# Patient Record
Sex: Female | Born: 1985 | Race: Black or African American | Hispanic: No | Marital: Married | Smoking: Former smoker
Health system: Southern US, Community
[De-identification: ages and names within clinical notes are randomized; demographics above are authoritative.]

## PROBLEM LIST (undated history)

## (undated) DIAGNOSIS — Z8709 Personal history of other diseases of the respiratory system: Secondary | ICD-10-CM

## (undated) HISTORY — DX: Personal history of other diseases of the respiratory system: Z87.09

## (undated) SURGERY — OPEN REDUCTION INTERNAL FIXATION (ORIF) RADIAL FRACTURE
Anesthesia: Choice | Laterality: Left

---

## 2009-05-12 DIAGNOSIS — O4290 Premature rupture of membranes, unspecified as to length of time between rupture and onset of labor, unspecified weeks of gestation: Secondary | ICD-10-CM

## 2010-06-24 ENCOUNTER — Emergency Department: Payer: Self-pay | Admitting: Emergency Medicine

## 2012-03-18 ENCOUNTER — Emergency Department: Payer: Self-pay | Admitting: Internal Medicine

## 2013-06-23 ENCOUNTER — Encounter (HOSPITAL_COMMUNITY): Payer: Self-pay | Admitting: Emergency Medicine

## 2013-06-23 ENCOUNTER — Emergency Department (HOSPITAL_COMMUNITY): Payer: Medicaid Other

## 2013-06-23 ENCOUNTER — Encounter (HOSPITAL_COMMUNITY): Payer: Medicaid Other | Admitting: Anesthesiology

## 2013-06-23 ENCOUNTER — Observation Stay (HOSPITAL_COMMUNITY)
Admission: EM | Admit: 2013-06-23 | Discharge: 2013-06-24 | Disposition: A | Discharge status: Home / Self Care | Payer: Medicaid Other | Attending: Orthopedic Surgery | Admitting: Orthopedic Surgery

## 2013-06-23 ENCOUNTER — Emergency Department (HOSPITAL_COMMUNITY): Payer: Medicaid Other | Admitting: Anesthesiology

## 2013-06-23 ENCOUNTER — Encounter (HOSPITAL_COMMUNITY)
Admission: EM | Disposition: A | Discharge status: Home / Self Care | Payer: Self-pay | Source: Home / Self Care | Attending: Emergency Medicine

## 2013-06-23 DIAGNOSIS — F17200 Nicotine dependence, unspecified, uncomplicated: Secondary | ICD-10-CM | POA: Insufficient documentation

## 2013-06-23 DIAGNOSIS — S5290XA Unspecified fracture of unspecified forearm, initial encounter for closed fracture: Principal | ICD-10-CM | POA: Insufficient documentation

## 2013-06-23 DIAGNOSIS — S52202A Unspecified fracture of shaft of left ulna, initial encounter for closed fracture: Secondary | ICD-10-CM

## 2013-06-23 DIAGNOSIS — S52209A Unspecified fracture of shaft of unspecified ulna, initial encounter for closed fracture: Secondary | ICD-10-CM

## 2013-06-23 HISTORY — PX: ORIF ULNAR FRACTURE: SHX5417

## 2013-06-23 LAB — CBC
Hemoglobin: 12.5 g/dL (ref 12.0–15.0)
MCH: 27.3 pg (ref 26.0–34.0)
Platelets: 310 10*3/uL (ref 150–400)
RBC: 4.58 MIL/uL (ref 3.87–5.11)
WBC: 11.1 10*3/uL — ABNORMAL HIGH (ref 4.0–10.5)

## 2013-06-23 LAB — COMPREHENSIVE METABOLIC PANEL
ALT: 9 U/L (ref 0–35)
AST: 16 U/L (ref 0–37)
Alkaline Phosphatase: 88 U/L (ref 39–117)
CO2: 25 mEq/L (ref 19–32)
Calcium: 9 mg/dL (ref 8.4–10.5)
Chloride: 101 mEq/L (ref 96–112)
GFR calc non Af Amer: >90 mL/min (ref >90)
Potassium: 3.3 mEq/L — ABNORMAL LOW (ref 3.5–5.1)
Sodium: 137 mEq/L (ref 135–145)
Total Bilirubin: 0.3 mg/dL (ref 0.3–1.2)

## 2013-06-23 LAB — LACTIC ACID, PLASMA: Lactic Acid, Venous: 1.7 mmol/L (ref 0.5–2.2)

## 2013-06-23 SURGERY — OPEN REDUCTION INTERNAL FIXATION (ORIF) ULNAR FRACTURE
Anesthesia: General | Site: Arm Lower | Laterality: Left | Wound class: Clean

## 2013-06-23 MED ORDER — FENTANYL CITRATE 0.05 MG/ML IJ SOLN
INTRAMUSCULAR | Status: DC | PRN
  Administered 2013-06-23 (×2): 50 ug via INTRAVENOUS

## 2013-06-23 MED ORDER — SODIUM CHLORIDE 0.9 % IV SOLN
INTRAVENOUS | Status: DC
  Administered 2013-06-23: via INTRAVENOUS

## 2013-06-23 MED ORDER — BUPIVACAINE-EPINEPHRINE PF 0.5-1:200000 % IJ SOLN
INTRAMUSCULAR | Status: DC | PRN
  Administered 2013-06-23: 30 mL via PERINEURAL

## 2013-06-23 MED ORDER — FENTANYL CITRATE 0.05 MG/ML IJ SOLN
50.0000 ug | Freq: Once | INTRAMUSCULAR | Status: AC
  Administered 2013-06-23: 50 ug via INTRAVENOUS
  Filled 2013-06-23: qty 2

## 2013-06-23 MED ORDER — OXYCODONE-ACETAMINOPHEN 5-325 MG PO TABS: 1.0000 | ORAL_TABLET | Freq: Four times a day (QID) | ORAL | Status: DC | PRN

## 2013-06-23 MED ORDER — HYDROMORPHONE HCL PF 1 MG/ML IJ SOLN
1.0000 mg | INTRAMUSCULAR | Status: DC | PRN
  Administered 2013-06-23 (×2): 1 mg via INTRAVENOUS
  Filled 2013-06-23 (×2): qty 1

## 2013-06-23 MED ORDER — HYDROMORPHONE HCL PF 1 MG/ML IJ SOLN: 0.2500 mg | INTRAMUSCULAR | Status: DC | PRN

## 2013-06-23 MED ORDER — ONDANSETRON HCL 4 MG/2ML IJ SOLN
4.0000 mg | Freq: Once | INTRAMUSCULAR | Status: AC
  Administered 2013-06-23: 4 mg via INTRAVENOUS
  Filled 2013-06-23: qty 2

## 2013-06-23 MED ORDER — ONDANSETRON HCL 4 MG/2ML IJ SOLN
INTRAMUSCULAR | Status: DC | PRN
  Administered 2013-06-23: 4 mg via INTRAVENOUS

## 2013-06-23 MED ORDER — ONDANSETRON HCL 4 MG/2ML IJ SOLN: 4.0000 mg | Freq: Once | INTRAMUSCULAR | Status: DC | PRN

## 2013-06-23 MED ORDER — MEPERIDINE HCL 25 MG/ML IJ SOLN: 6.2500 mg | INTRAMUSCULAR | Status: DC | PRN

## 2013-06-23 MED ORDER — OXYCODONE HCL 5 MG PO TABS: 5.0000 mg | ORAL_TABLET | Freq: Once | ORAL | Status: DC | PRN

## 2013-06-23 MED ORDER — LIDOCAINE HCL (CARDIAC) 20 MG/ML IV SOLN
INTRAVENOUS | Status: DC | PRN
  Administered 2013-06-23: 100 mg via INTRAVENOUS

## 2013-06-23 MED ORDER — SUCCINYLCHOLINE CHLORIDE 20 MG/ML IJ SOLN
INTRAMUSCULAR | Status: DC | PRN
  Administered 2013-06-23: 100 mg via INTRAVENOUS

## 2013-06-23 MED ORDER — CEFAZOLIN SODIUM-DEXTROSE 2-3 GM-% IV SOLR
INTRAVENOUS | Status: DC | PRN
  Administered 2013-06-23: 2 g via INTRAVENOUS

## 2013-06-23 MED ORDER — LACTATED RINGERS IV SOLN
INTRAVENOUS | Status: DC | PRN
  Administered 2013-06-23: 20:00:00 via INTRAVENOUS

## 2013-06-23 MED ORDER — MIDAZOLAM HCL 5 MG/5ML IJ SOLN
INTRAMUSCULAR | Status: DC | PRN
  Administered 2013-06-23: 2 mg via INTRAVENOUS

## 2013-06-23 MED ORDER — PROPOFOL 10 MG/ML IV BOLUS
INTRAVENOUS | Status: DC | PRN
  Administered 2013-06-23: 200 mg via INTRAVENOUS

## 2013-06-23 MED ORDER — OXYCODONE HCL 5 MG/5ML PO SOLN
5.0000 mg | Freq: Once | ORAL | Status: DC | PRN
Start: 2013-06-23 — End: 2013-06-23

## 2013-06-23 MED ORDER — EPHEDRINE SULFATE 50 MG/ML IJ SOLN
INTRAMUSCULAR | Status: DC | PRN
  Administered 2013-06-23 (×2): 10 mg via INTRAVENOUS

## 2013-06-23 SURGICAL SUPPLY — 56 items
BANDAGE ELASTIC 3 VELCRO ST LF (GAUZE/BANDAGES/DRESSINGS) ×2 IMPLANT
BANDAGE ELASTIC 4 VELCRO ST LF (GAUZE/BANDAGES/DRESSINGS) ×2 IMPLANT
BANDAGE GAUZE ELAST BULKY 4 IN (GAUZE/BANDAGES/DRESSINGS) ×2 IMPLANT
BIT DRILL 2.5X2.75 QC CALB (BIT) ×2 IMPLANT
BIT DRILL CALIBRATED 2.7 (BIT) ×2 IMPLANT
BLADE SURG ROTATE 9660 (MISCELLANEOUS) IMPLANT
BNDG ESMARK 4X9 LF (GAUZE/BANDAGES/DRESSINGS) ×2 IMPLANT
CLOTH BEACON ORANGE TIMEOUT ST (SAFETY) ×2 IMPLANT
COVER SURGICAL LIGHT HANDLE (MISCELLANEOUS) ×2 IMPLANT
CUFF TOURNIQUET SINGLE 18IN (TOURNIQUET CUFF) ×2 IMPLANT
CUFF TOURNIQUET SINGLE 24IN (TOURNIQUET CUFF) IMPLANT
DRAPE OEC MINIVIEW 54X84 (DRAPES) ×2 IMPLANT
DRAPE U-SHAPE 47X51 STRL (DRAPES) ×2 IMPLANT
ELECT REM PT RETURN 9FT ADLT (ELECTROSURGICAL) ×2
ELECTRODE REM PT RTRN 9FT ADLT (ELECTROSURGICAL) ×1 IMPLANT
GAUZE XEROFORM 5X9 LF (GAUZE/BANDAGES/DRESSINGS) ×2 IMPLANT
GLOVE BIO SURGEON STRL SZ7.5 (GLOVE) ×4 IMPLANT
GLOVE BIOGEL PI IND STRL 8 (GLOVE) ×2 IMPLANT
GLOVE BIOGEL PI INDICATOR 8 (GLOVE) ×2
GOWN PREVENTION PLUS XLARGE (GOWN DISPOSABLE) ×2 IMPLANT
GOWN SRG XL XLNG 56XLVL 4 (GOWN DISPOSABLE) ×1 IMPLANT
GOWN STRL NON-REIN LRG LVL3 (GOWN DISPOSABLE) ×2 IMPLANT
GOWN STRL NON-REIN XL XLG LVL4 (GOWN DISPOSABLE) ×1
KIT BASIN OR (CUSTOM PROCEDURE TRAY) ×2 IMPLANT
KIT ROOM TURNOVER OR (KITS) ×2 IMPLANT
MANIFOLD NEPTUNE II (INSTRUMENTS) IMPLANT
NEEDLE 22X1 1/2 (OR ONLY) (NEEDLE) IMPLANT
NS IRRIG 1000ML POUR BTL (IV SOLUTION) ×2 IMPLANT
PACK ORTHO EXTREMITY (CUSTOM PROCEDURE TRAY) ×2 IMPLANT
PAD ARMBOARD 7.5X6 YLW CONV (MISCELLANEOUS) IMPLANT
PAD CAST 3X4 CTTN HI CHSV (CAST SUPPLIES) ×1 IMPLANT
PAD CAST 4YDX4 CTTN HI CHSV (CAST SUPPLIES) ×1 IMPLANT
PADDING CAST COTTON 3X4 STRL (CAST SUPPLIES) ×1
PADDING CAST COTTON 4X4 STRL (CAST SUPPLIES) ×1
PLATE LOCK COMP 6H FOOT (Plate) ×2 IMPLANT
PLATE LOCK COMP 7H FOOT (Plate) ×2 IMPLANT
SCREW CORTICAL 3.5MM  16MM (Screw) ×1 IMPLANT
SCREW CORTICAL 3.5MM 14MM (Screw) ×6 IMPLANT
SCREW CORTICAL 3.5MM 16MM (Screw) ×1 IMPLANT
SCREW LOCK CORT STAR 3.5X10 (Screw) ×8 IMPLANT
SCREW LOCK CORT STAR 3.5X12 (Screw) ×6 IMPLANT
SCREW LOCK CORT STAR 3.5X14 (Screw) ×2 IMPLANT
SLING ARM FOAM STRAP LRG (SOFTGOODS) ×2 IMPLANT
SPLINT FIBERGLASS 3X12 (CAST SUPPLIES) ×2 IMPLANT
SPONGE GAUZE 4X4 12PLY (GAUZE/BANDAGES/DRESSINGS) ×2 IMPLANT
SPONGE LAP 4X18 X RAY DECT (DISPOSABLE) ×2 IMPLANT
SUCTION FRAZIER TIP 10 FR DISP (SUCTIONS) ×2 IMPLANT
SUT ETHILON 4 0 PS 2 18 (SUTURE) IMPLANT
SUT MNCRL AB 3-0 PS2 18 (SUTURE) ×2 IMPLANT
SUT VIC AB 2-0 CT1 27 (SUTURE) ×1
SUT VIC AB 2-0 CT1 TAPERPNT 27 (SUTURE) ×1 IMPLANT
SYR CONTROL 10ML LL (SYRINGE) IMPLANT
TOWEL OR 17X24 6PK STRL BLUE (TOWEL DISPOSABLE) ×2 IMPLANT
TOWEL OR 17X26 10 PK STRL BLUE (TOWEL DISPOSABLE) ×2 IMPLANT
TUBE CONNECTING 12X1/4 (SUCTIONS) ×2 IMPLANT
WATER STERILE IRR 1000ML POUR (IV SOLUTION) IMPLANT

## 2013-06-23 NOTE — Transfer of Care (Signed)
Immediate Anesthesia Transfer of Care Note  Patient: Judy Schultz  Procedure(s) Performed: Procedure(s): OPEN REDUCTION INTERNAL FIXATION (ORIF) ULNAR/Radius FRACTURE (Left)  Patient Location: PACU  Anesthesia Type:General  Level of Consciousness: awake, alert  and oriented  Airway & Oxygen Therapy: Patient Spontanous Breathing and Patient connected to nasal cannula oxygen  Post-op Assessment: Report given to PACU RN and Post -op Vital signs reviewed and stable  Post vital signs: Reviewed and stable  Complications: No apparent anesthesia complications

## 2013-06-23 NOTE — Progress Notes (Signed)
The patient has not had pain in c-spine per patient and er staff.  X-rays c1-c6 normal and ct scan recommended but ER staff felt not indicated because she has nort had pain.  I have removed collar and carefully tested her tenderness which does not exist.  No pain with forward flex against resistance and no pain with extension against resistance.  No pain with side bending and no pain with flex/ext.  Pt cleared clinically and c-collar removed.

## 2013-06-23 NOTE — ED Notes (Signed)
MD at bedside. 

## 2013-06-23 NOTE — ED Notes (Signed)
Dr. Luiz Blare in to talk and assess pt for surgery.  OR consent signed by pt's husband.  Pt to OR

## 2013-06-23 NOTE — Brief Op Note (Signed)
06/23/2013  10:21 PM  PATIENT:  Judy Schultz  27 y.o. female  PRE-OPERATIVE DIAGNOSIS:  Left Ulnar/Radius Fracture  POST-OPERATIVE DIAGNOSIS:  Left Ulnar/Radius Fracture  PROCEDURE:  Procedure(s): OPEN REDUCTION INTERNAL FIXATION (ORIF) ULNAR/Radius FRACTURE (Left)  SURGEON:  Surgeon(s) and Role:    * Harvie Junior, MD - Primary  PHYSICIAN ASSISTANT:   ASSISTANTS: bethune   ANESTHESIA:   general  EBL:     BLOOD ADMINISTERED:none  DRAINS: none   LOCAL MEDICATIONS USED:  NONE  SPECIMEN:  No Specimen  DISPOSITION OF SPECIMEN:  N/A  COUNTS:  YES  TOURNIQUET:  * Missing tourniquet times found for documented tourniquets in log:  [id number] *  DICTATION: .Other Dictation: Dictation Number [id number]  PLAN OF CARE: Admit for overnight observation  PATIENT DISPOSITION:  PACU - hemodynamically stable.   Delay start of Pharmacological VTE agent (>24hrs) due to surgical blood loss or risk of bleeding: no
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
## 2013-06-23 NOTE — Anesthesia Procedure Notes (Addendum)
Anesthesia Regional Block:  Supraclavicular block  Pre-Anesthetic Checklist: ,, timeout performed, Correct Patient, Correct Site, Correct Laterality, Correct Procedure, Correct Position, site marked, Risks and benefits discussed,  Surgical consent,  Pre-op evaluation,  At surgeon's request and post-op pain management  Laterality: Left  Prep: chloraprep       Needles:  Injection technique: Single-shot  Needle Type: Echogenic Stimulator Needle     Needle Length: 9cm  Needle Gauge: 21 and 21 G    Additional Needles:  Procedures: ultrasound guided (picture in chart) and nerve stimulator Supraclavicular block  Nerve Stimulator or Paresthesia:  Response: 0.4 mA,   Additional Responses:   Narrative:  Start time: 06/23/2013 8:30 PM End time: 06/23/2013 8:45 PM Injection made incrementally with aspirations every 5 mL.  Performed by: Personally  Anesthesiologist: Arta Bruce MD  Additional Notes: Monitors applied. Patient sedated. Sterile prep and drape,hand hygiene and sterile gloves were used. Relevant anatomy identified.Needle position confirmed.Local anesthetic injected incrementally after negative aspiration. Local anesthetic spread visualized around nerve(s). Vascular puncture avoided. No complications. Image printed for medical record.The patient tolerated the procedure well.        Procedure Name: Intubation Date/Time: 06/23/2013 8:58 PM Performed by: Molli Hazard Pre-anesthesia Checklist: Patient identified, Emergency Drugs available, Suction available and Patient being monitored Patient Re-evaluated:Patient Re-evaluated prior to inductionOxygen Delivery Method: Circle system utilized Preoxygenation: Pre-oxygenation with 100% oxygen Intubation Type: IV induction, Rapid sequence and Cricoid Pressure applied Laryngoscope Size: Miller and 2 Grade View: Grade I Tube type: Oral Tube size: 7.5 mm Number of attempts: 1 Airway Equipment and Method: Stylet Placement  Confirmation: ETT inserted through vocal cords under direct vision,  positive ETCO2 and breath sounds checked- equal and bilateral Secured at: 21 cm Tube secured with: Tape Dental Injury: Teeth and Oropharynx as per pre-operative assessment

## 2013-06-23 NOTE — ED Notes (Signed)
Patient returned from XR. 
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
## 2013-06-23 NOTE — Preoperative (Signed)
Beta Blockers   Reason not to administer Beta Blockers:Not Applicable 

## 2013-06-23 NOTE — Anesthesia Preprocedure Evaluation (Signed)
Anesthesia Evaluation  Patient identified by MRN, date of birth, ID band Patient awake    Reviewed: Allergy & Precautions, H&P , NPO status , Patient's Chart, lab work & pertinent test results  Airway Mallampati: I TM Distance: >3 FB Neck ROM: Full    Dental   Pulmonary Current Smoker,          Cardiovascular     Neuro/Psych    GI/Hepatic   Endo/Other    Renal/GU      Musculoskeletal   Abdominal   Peds  Hematology   Anesthesia Other Findings   Reproductive/Obstetrics                           Anesthesia Physical Anesthesia Plan  ASA: II and emergent  Anesthesia Plan: General   Post-op Pain Management:    Induction: Intravenous, Rapid sequence and Cricoid pressure planned  Airway Management Planned: Oral ETT  Additional Equipment:   Intra-op Plan:   Post-operative Plan: Extubation in OR  Informed Consent: I have reviewed the patients History and Physical, chart, labs and discussed the procedure including the risks, benefits and alternatives for the proposed anesthesia with the patient or authorized representative who has indicated his/her understanding and acceptance.     Plan Discussed with: CRNA and Surgeon  Anesthesia Plan Comments:         Anesthesia Quick Evaluation  

## 2013-06-24 MED ORDER — METHOCARBAMOL 100 MG/ML IJ SOLN: 500.0000 mg | Freq: Four times a day (QID) | INTRAVENOUS | Status: DC | PRN

## 2013-06-24 MED ORDER — PROMETHAZINE HCL 25 MG/ML IJ SOLN: 12.5000 mg | Freq: Four times a day (QID) | INTRAMUSCULAR | Status: DC | PRN

## 2013-06-24 MED ORDER — CEFAZOLIN SODIUM-DEXTROSE 2-3 GM-% IV SOLR
2.0000 g | Freq: Four times a day (QID) | INTRAVENOUS | Status: DC
  Administered 2013-06-24 (×2): 2 g via INTRAVENOUS
  Filled 2013-06-24 (×3): qty 50

## 2013-06-24 MED ORDER — ONDANSETRON HCL 4 MG/2ML IJ SOLN: 4.0000 mg | Freq: Four times a day (QID) | INTRAMUSCULAR | Status: DC | PRN

## 2013-06-24 MED ORDER — OXYCODONE-ACETAMINOPHEN 5-325 MG PO TABS
1.0000 | ORAL_TABLET | ORAL | Status: DC | PRN
  Administered 2013-06-24 (×2): 2 via ORAL
  Filled 2013-06-24 (×2): qty 2

## 2013-06-24 MED ORDER — ONDANSETRON HCL 4 MG PO TABS: 4.0000 mg | ORAL_TABLET | Freq: Four times a day (QID) | ORAL | Status: DC | PRN

## 2013-06-24 MED ORDER — METHOCARBAMOL 500 MG PO TABS
500.0000 mg | ORAL_TABLET | Freq: Four times a day (QID) | ORAL | Status: DC | PRN
  Administered 2013-06-24: 500 mg via ORAL
  Filled 2013-06-24: qty 1

## 2013-06-24 MED ORDER — HYDROMORPHONE HCL PF 1 MG/ML IJ SOLN: 1.0000 mg | INTRAMUSCULAR | Status: DC | PRN

## 2013-06-24 NOTE — Anesthesia Postprocedure Evaluation (Signed)
Anesthesia Post Note  Patient: Judy Schultz  Procedure(s) Performed: Procedure(s) (LRB): OPEN REDUCTION INTERNAL FIXATION (ORIF) ULNAR/Radius FRACTURE (Left)  Anesthesia type: general  Patient location: PACU  Post pain: Pain level controlled  Post assessment: Patient's Cardiovascular Status Stable  Last Vitals:  Filed Vitals:   06/23/13 2356  BP: 105/50  Pulse: 76  Temp: 36.4 C  Resp: 16    Post vital signs: Reviewed and stable  Level of consciousness: sedated  Complications: No apparent anesthesia complications
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
## 2013-06-24 NOTE — Progress Notes (Signed)
Subjective: 1 Day Post-Op Procedure(s) (LRB): OPEN REDUCTION INTERNAL FIXATION (ORIF) ULNAR/Radius FRACTURE (Left) Patient reports pain as 2 on 0-10 scale.   Denies neck pain. No other c/o's. Taking po/voiding ok. Objective: Vital signs in last 24 hours: Temp:  [97.5 F (36.4 C)-98.6 F (37 C)] 98.6 F (37 C) (11/27 0539) Pulse Rate:  [68-100] 100 (11/27 0539) Resp:  [16-20] 16 (11/27 0539) BP: (105-135)/(46-65) 126/53 mmHg (11/27 0539) SpO2:  [95 %-100 %] 95 % (11/27 0539) Weight:  [95.255 kg (210 lb)] 95.255 kg (210 lb) (11/26 1915)  Intake/Output from previous day: 11/26 0701 - 11/27 0700 In: 1978.8 [P.O.:460; I.V.:1518.8] Out: 1 [Urine:1] Intake/Output this shift: Total I/O In: 50 [IV Piggyback:50] Out: -    Recent Labs  06/23/13 1645  HGB 12.5    Recent Labs  06/23/13 1645  WBC 11.1*  RBC 4.58  HCT 37.3  PLT 310    Recent Labs  06/23/13 1645  NA 137  K 3.3*  CL 101  CO2 25  BUN 7  CREATININE 0.78  GLUCOSE 107*  CALCIUM 9.0  Neurologically intact Neurovascular intact Sensation intact distally Sugar tong splint intact to left upper ext. Neck nontender  FCROM.   Assessment/Plan: 1 Day Post-Op Procedure(s) (LRB): OPEN REDUCTION INTERNAL FIXATION (ORIF) ULNAR/Radius FRACTURE (Left) Plan: Discharge home. F/U Dr Luiz Blare in 2 weeks.  Splint loosened. Briar Sword G 06/24/2013, 10:29 AM
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
## 2013-06-24 NOTE — Progress Notes (Signed)
Orthopedic Tech Progress Note Patient Details:  Judy Schultz 1986-01-01 [id number] Arm sling applied for discharge Ortho Devices Type of Ortho Device: Arm sling Ortho Device/Splint Interventions: Application   Asia R Thompson 06/24/2013, 12:16 PM

## 2013-06-29 ENCOUNTER — Encounter (HOSPITAL_COMMUNITY): Payer: Self-pay | Admitting: Orthopedic Surgery

## 2015-07-04 ENCOUNTER — Ambulatory Visit (INDEPENDENT_AMBULATORY_CARE_PROVIDER_SITE_OTHER): Payer: BLUE CROSS/BLUE SHIELD | Admitting: Certified Nurse Midwife

## 2015-07-04 ENCOUNTER — Encounter: Payer: Self-pay | Admitting: Certified Nurse Midwife

## 2015-07-04 VITALS — BP 114/70 | HR 82 | Ht 68.0 in | Wt 252.0 lb

## 2015-07-04 DIAGNOSIS — Z3687 Encounter for antenatal screening for uncertain dates: Secondary | ICD-10-CM

## 2015-07-04 DIAGNOSIS — Z331 Pregnant state, incidental: Secondary | ICD-10-CM

## 2015-07-04 DIAGNOSIS — Z36 Encounter for antenatal screening of mother: Secondary | ICD-10-CM

## 2015-07-04 DIAGNOSIS — N926 Irregular menstruation, unspecified: Secondary | ICD-10-CM

## 2015-07-04 DIAGNOSIS — Z3490 Encounter for supervision of normal pregnancy, unspecified, unspecified trimester: Secondary | ICD-10-CM

## 2015-07-04 LAB — POCT URINE PREGNANCY: PREG TEST UR: POSITIVE — AB

## 2015-07-04 NOTE — Progress Notes (Signed)
Patient ID: Judy Schultz, female   DOB: 25-Aug-1985, 29 y.o.   MRN: [id number] Subjective:    Judy PaddockJasmine Wheelwright is a 29 y.o. female who presents for evaluation of amenorrhea. She believes she could be pregnant. Pregnancy is desired. Sexual Activity: single partner, contraception: none. Current symptoms also include: breast tenderness, frequent urination and nausea. Last period was normal.   Patient's last menstrual period was 05/27/2015 (approximate). The following portions of the patient's history were reviewed and updated as appropriate: allergies, current medications, past family history, past medical history, past social history, past surgical history and problem list.  Review of Systems Pertinent items noted in HPI and remainder of comprehensive ROS otherwise negative.     Objective:    BP 114/70 mmHg  Pulse 82  Ht 5\' 8"  (1.727 m)  Wt 252 lb (114.306 kg)  BMI 38.33 kg/m2  LMP 05/27/2015 (Approximate) General: alert, cooperative, appears stated age and no acute distress    Lab Review Urine HCG: positive    Assessment:    Absence of menstruation.     Plan:    Pregnancy Test: Positive: EDC: 03/02/16. Briefly discussed pre-natal care options. Pregnancy, Childbirth and the Newborn book given. Encouraged well-balanced diet, plenty of rest when needed, pre-natal vitamins daily and walking for exercise. Discussed self-help for nausea, avoiding OTC medications until consulting provider or pharmacist, other than Tylenol as needed, minimal caffeine (1-2 cups daily) and avoiding alcohol. She will schedule her initial OB visit in the next month with her PCP or OB provider. Feel free to call with any questions. .   Return in 4 weeks for dating sonogram and RN NOB intake 30 minute visit with greater than 50 % spent on counseling.

## 2015-07-04 NOTE — Progress Notes (Signed)
Patient ID: Judy Schultz, female   DOB: 02-Feb-1986, 29 y.o.   MRN: [id number] Pt presents for pregnancy confirmation. UPT: positive. LMP: 05/27/2015. EGA: 5.3 Wks. EDD 03/02/2016. G2 P1001

## 2015-07-30 NOTE — L&D Delivery Note (Signed)
Delivery Note Called to room for precipitous delivery.  At 2:13 PM a viable and healthy female was delivered via  (Presentation: LOA ).  Double nuchal and one body cord.   APGAR: 7, 9; weight 3 lb 5.6 oz (1520 g).   Placenta status: intact > to pathology, .  Cord: 3 vessels with the following complications: .  Cord pH: n/a  Anesthesia:  Epidural Episiotomy:  None Lacerations:  None Suture Repair: n/a Est. Blood Loss (mL):  350; increased bleeding after delivery, fundal massage given, not tolerated well by patient.  Pitocin infusing.  Vaginal vault and lower uterine segment explored with removal of mod amount of clots.  Cytotec 600 mcg ordered.    Mom to postpartum.  Baby to NICU.  Rochele PagesKARIM, WALIDAH N 01/13/2016, 2:50 PM

## 2015-08-02 ENCOUNTER — Other Ambulatory Visit: Payer: BLUE CROSS/BLUE SHIELD

## 2015-08-03 ENCOUNTER — Ambulatory Visit: Payer: BLUE CROSS/BLUE SHIELD | Admitting: Certified Nurse Midwife

## 2015-08-03 ENCOUNTER — Ambulatory Visit (INDEPENDENT_AMBULATORY_CARE_PROVIDER_SITE_OTHER): Payer: Medicaid Other

## 2015-08-03 VITALS — BP 106/63 | HR 69 | Wt 251.3 lb

## 2015-08-03 DIAGNOSIS — Z3490 Encounter for supervision of normal pregnancy, unspecified, unspecified trimester: Secondary | ICD-10-CM

## 2015-08-03 DIAGNOSIS — Z331 Pregnant state, incidental: Secondary | ICD-10-CM

## 2015-08-03 DIAGNOSIS — Z1389 Encounter for screening for other disorder: Secondary | ICD-10-CM

## 2015-08-03 DIAGNOSIS — Z113 Encounter for screening for infections with a predominantly sexual mode of transmission: Secondary | ICD-10-CM

## 2015-08-03 DIAGNOSIS — R638 Other symptoms and signs concerning food and fluid intake: Secondary | ICD-10-CM

## 2015-08-03 DIAGNOSIS — Z36 Encounter for antenatal screening of mother: Secondary | ICD-10-CM

## 2015-08-03 DIAGNOSIS — Z3687 Encounter for antenatal screening for uncertain dates: Secondary | ICD-10-CM

## 2015-08-03 NOTE — Progress Notes (Unsigned)
  Carmelina PaddockJasmine Gashi presents for NOB nurse interview visit. G-2.  P-1001. Ultra sound done today resulting in EDD: 03/02/2016. Pregnancy education material explained and given. No cats in the home. NOB labs ordered. TSH/HbgA1c due to Increased BMI, HIV labs and Drug screen were explained optional and she could opt out of tests but did not decline. Drug screen ordered. PNV encouraged. NT to discuss with provider. Pt. To follow up with provider in 3 weeks for NOB physical.  All questions answered.  ZIKA EXPOSURE SCREEN:  The patient has not traveled to a [place] endemic area within the past 6 months, nor has she had unprotected sex with a partner who has travelled to a [place] endemic region within the past 6 months. The patient has been advised to notify us if these factors change any time during this current pregnancy, so adequate testing and monitoring can be initiated.

## 2015-08-03 NOTE — Patient Instructions (Signed)

## 2015-08-04 LAB — CBC WITH DIFFERENTIAL/PLATELET
BASOS ABS: 0 10*3/uL (ref 0.0–0.2)
Basos: 0 %
EOS (ABSOLUTE): 0.1 10*3/uL (ref 0.0–0.4)
Eos: 2 %
HEMOGLOBIN: 11.9 g/dL (ref 11.1–15.9)
Hematocrit: 34.7 % (ref 34.0–46.6)
Immature Grans (Abs): 0 10*3/uL (ref 0.0–0.1)
Immature Granulocytes: 0 %
LYMPHS ABS: 2.5 10*3/uL (ref 0.7–3.1)
Lymphs: 31 %
MCH: 26.9 pg (ref 26.6–33.0)
MCHC: 34.3 g/dL (ref 31.5–35.7)
MCV: 79 fL (ref 79–97)
MONOCYTES: 7 %
MONOS ABS: 0.5 10*3/uL (ref 0.1–0.9)
Neutrophils Absolute: 4.9 10*3/uL (ref 1.4–7.0)
Neutrophils: 60 %
Platelets: 333 10*3/uL (ref 150–379)
RBC: 4.42 x10E6/uL (ref 3.77–5.28)
RDW: 13.5 % (ref 12.3–15.4)
WBC: 8.1 10*3/uL (ref 3.4–10.8)

## 2015-08-04 LAB — URINALYSIS, ROUTINE W REFLEX MICROSCOPIC
BILIRUBIN UA: NEGATIVE
GLUCOSE, UA: NEGATIVE
Nitrite, UA: NEGATIVE
RBC UA: NEGATIVE
Specific Gravity, UA: >=1.03 — AB (ref 1.005–1.030)
UUROB: 0.2 mg/dL (ref 0.2–1.0)
pH, UA: 6 (ref 5.0–7.5)

## 2015-08-04 LAB — MICROSCOPIC EXAMINATION: Casts: NONE SEEN /lpf

## 2015-08-04 LAB — RH TYPE: Rh Factor: POSITIVE

## 2015-08-04 LAB — GC/CHLAMYDIA PROBE AMP
CHLAMYDIA, DNA PROBE: NEGATIVE
Neisseria gonorrhoeae by PCR: NEGATIVE

## 2015-08-04 LAB — HEMOGLOBIN A1C
ESTIMATED AVERAGE GLUCOSE: 120 mg/dL
Hgb A1c MFr Bld: 5.8 % — ABNORMAL HIGH (ref 4.8–5.6)

## 2015-08-04 LAB — TSH: TSH: 0.936 u[IU]/mL (ref 0.450–4.500)

## 2015-08-04 LAB — RPR: RPR Ser Ql: NONREACTIVE

## 2015-08-04 LAB — HIV ANTIBODY (ROUTINE TESTING W REFLEX): HIV Screen 4th Generation wRfx: NONREACTIVE

## 2015-08-04 LAB — ABO

## 2015-08-04 LAB — HEPATITIS B SURFACE ANTIGEN: Hepatitis B Surface Ag: NEGATIVE

## 2015-08-04 LAB — VARICELLA ZOSTER ANTIBODY, IGM: Varicella IgM: <0.91 index (ref 0.00–0.90)

## 2015-08-04 LAB — RUBELLA ANTIBODY, IGM: Rubella IgM: <20 AU/mL (ref 0.0–19.9)

## 2015-08-04 LAB — ANTIBODY SCREEN: ANTIBODY SCREEN: NEGATIVE

## 2015-08-04 LAB — SICKLE CELL SCREEN: SICKLE CELL SCREEN: NEGATIVE

## 2015-08-04 MED ORDER — CONCEPT DHA 53.5-38-1 MG PO CAPS: 1.0000 | ORAL_CAPSULE | Freq: Every day | ORAL | Status: DC

## 2015-08-05 LAB — PAIN MGT SCRN (14 DRUGS), UR
AMPHETAMINE SCRN UR: NEGATIVE ng/mL
Barbiturate Screen, Ur: NEGATIVE ng/mL
Benzodiazepine Screen, Urine: NEGATIVE ng/mL
Buprenorphine, Urine: NEGATIVE ng/mL
CANNABINOIDS UR QL SCN: POSITIVE ng/mL
COCAINE(METAB.) SCREEN, URINE: NEGATIVE ng/mL
CREATININE(CRT), U: 366 mg/dL — AB (ref 20.0–300.0)
FENTANYL, URINE: NEGATIVE pg/mL
MEPERIDINE SCREEN, URINE: NEGATIVE ng/mL
METHADONE SCREEN, URINE: NEGATIVE ng/mL
OXYCODONE+OXYMORPHONE UR QL SCN: NEGATIVE ng/mL
Opiate Scrn, Ur: NEGATIVE ng/mL
PCP SCRN UR: NEGATIVE ng/mL
PH UR, DRUG SCRN: 5.8 (ref 4.5–8.9)
Propoxyphene, Screen: NEGATIVE ng/mL
TRAMADOL UR QL SCN: NEGATIVE ng/mL

## 2015-08-05 LAB — CULTURE, OB URINE

## 2015-08-05 LAB — URINE CULTURE, OB REFLEX

## 2015-08-05 LAB — NICOTINE SCREEN, URINE: COTININE UR QL SCN: NEGATIVE ng/mL

## 2015-08-23 ENCOUNTER — Encounter: Payer: BLUE CROSS/BLUE SHIELD | Admitting: Certified Nurse Midwife

## 2015-08-23 ENCOUNTER — Encounter: Payer: BLUE CROSS/BLUE SHIELD | Admitting: Obstetrics and Gynecology

## 2015-08-29 ENCOUNTER — Ambulatory Visit (INDEPENDENT_AMBULATORY_CARE_PROVIDER_SITE_OTHER): Payer: Medicaid Other | Admitting: Obstetrics and Gynecology

## 2015-08-29 ENCOUNTER — Encounter: Payer: Self-pay | Admitting: Obstetrics and Gynecology

## 2015-08-29 VITALS — BP 112/60 | HR 85 | Wt 249.3 lb

## 2015-08-29 DIAGNOSIS — Z331 Pregnant state, incidental: Secondary | ICD-10-CM

## 2015-08-29 LAB — POCT URINALYSIS DIPSTICK
Glucose, UA: NEGATIVE
KETONES UA: 5
Nitrite, UA: NEGATIVE
PH UA: 6.5
RBC UA: NEGATIVE
Spec Grav, UA: 1.02
Urobilinogen, UA: 0.2

## 2015-08-29 NOTE — Patient Instructions (Signed)
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
## 2015-08-29 NOTE — Progress Notes (Signed)
NOB- pt c/o some nausea, otherwise doing well

## 2015-09-22 ENCOUNTER — Other Ambulatory Visit: Payer: Self-pay | Admitting: *Deleted

## 2015-09-22 DIAGNOSIS — O99210 Obesity complicating pregnancy, unspecified trimester: Secondary | ICD-10-CM

## 2015-09-26 ENCOUNTER — Ambulatory Visit (INDEPENDENT_AMBULATORY_CARE_PROVIDER_SITE_OTHER): Payer: Medicaid Other | Admitting: Obstetrics and Gynecology

## 2015-09-26 ENCOUNTER — Other Ambulatory Visit: Payer: Self-pay

## 2015-09-26 ENCOUNTER — Other Ambulatory Visit: Payer: Self-pay | Admitting: Obstetrics and Gynecology

## 2015-09-26 ENCOUNTER — Encounter: Payer: Self-pay | Admitting: Obstetrics and Gynecology

## 2015-09-26 VITALS — BP 108/72 | HR 88 | Wt 245.4 lb

## 2015-09-26 DIAGNOSIS — O99210 Obesity complicating pregnancy, unspecified trimester: Secondary | ICD-10-CM

## 2015-09-26 DIAGNOSIS — Z331 Pregnant state, incidental: Secondary | ICD-10-CM

## 2015-09-26 DIAGNOSIS — Z3490 Encounter for supervision of normal pregnancy, unspecified, unspecified trimester: Secondary | ICD-10-CM

## 2015-09-26 NOTE — Progress Notes (Signed)
ROB- pt drank her glucola, wants to discuss her 17 p inj

## 2015-09-26 NOTE — Progress Notes (Signed)
ROB-doing better, still vomiting occasionally; considering 17-P injections, will let me know next visit.

## 2015-09-27 LAB — GLUCOSE, 1 HOUR GESTATIONAL: GESTATIONAL DIABETES SCREEN: 133 mg/dL (ref 65–139)

## 2015-10-11 ENCOUNTER — Encounter: Payer: Medicaid Other | Admitting: Obstetrics and Gynecology

## 2015-10-11 ENCOUNTER — Other Ambulatory Visit: Payer: Medicaid Other

## 2015-10-16 ENCOUNTER — Encounter: Payer: Self-pay | Admitting: Obstetrics and Gynecology

## 2015-10-18 ENCOUNTER — Ambulatory Visit (INDEPENDENT_AMBULATORY_CARE_PROVIDER_SITE_OTHER): Payer: Medicaid Other

## 2015-10-18 ENCOUNTER — Ambulatory Visit (INDEPENDENT_AMBULATORY_CARE_PROVIDER_SITE_OTHER): Payer: Medicaid Other | Admitting: Obstetrics and Gynecology

## 2015-10-18 ENCOUNTER — Encounter: Payer: Self-pay | Admitting: Obstetrics and Gynecology

## 2015-10-18 VITALS — BP 98/74 | HR 93 | Wt 245.4 lb

## 2015-10-18 DIAGNOSIS — Z331 Pregnant state, incidental: Secondary | ICD-10-CM

## 2015-10-18 DIAGNOSIS — Z3490 Encounter for supervision of normal pregnancy, unspecified, unspecified trimester: Secondary | ICD-10-CM

## 2015-10-18 LAB — POCT URINALYSIS DIPSTICK
GLUCOSE UA: NEGATIVE
Ketones, UA: NEGATIVE
Nitrite, UA: NEGATIVE
RBC UA: NEGATIVE
Spec Grav, UA: 1.015
UROBILINOGEN UA: 0.2
pH, UA: 6.5

## 2015-10-18 NOTE — Progress Notes (Signed)
ROB- anatomy scan done today, pt c/o of nausea gave samples of diclegis

## 2015-10-18 NOTE — Progress Notes (Signed)
Indications: Anatomy Findings:  Singleton intrauterine pregnancy is visualized with FHR at 145 BPM. Biometrics give an (U/S) Gestational age of [redacted] weeks 2 days, and an (U/S) EDD of 03/04/16; this correlates with the clinically established EDD of 03/02/16.  Fetal presentation is vertex, spine posterior.  EFW: 337 grams ( 0 lbs. 12 oz. ). Placenta: Anterior, grade 1 and remote to cervix by 6.0 cm. AFI: Adequate with a MVP of 6.3 cm.   Leaning towards not doing progesterone injections.  Anatomic survey is complete and appears WNL. Gender - Female.   Right Ovary measures 2.6 x 2.0 x 1.8 cm. It appears WNL. Left Ovary measures 3.1 x 2.1 x 2.0 cm. It appears WNL. There is no evidence of a corpus luteal cyst. Survey of the adnexa demonstrates no adnexal masses. There is no free peritoneal fluid in the cul de sac.  Impression: 1. 20 week 2 day Viable Singleton Intrauterine pregnancy by U/S. 2. (U/S) EDD is consistent with Clinically established (LMP) EDD of 03/02/16. 3. Normal appearing anatomy scan.

## 2015-10-24 ENCOUNTER — Encounter: Payer: Self-pay | Admitting: Obstetrics and Gynecology

## 2015-11-16 ENCOUNTER — Ambulatory Visit (INDEPENDENT_AMBULATORY_CARE_PROVIDER_SITE_OTHER): Payer: Medicaid Other | Admitting: Obstetrics and Gynecology

## 2015-11-16 ENCOUNTER — Encounter: Payer: Self-pay | Admitting: Obstetrics and Gynecology

## 2015-11-16 VITALS — BP 117/70 | HR 99 | Wt 248.2 lb

## 2015-11-16 DIAGNOSIS — Z331 Pregnant state, incidental: Secondary | ICD-10-CM

## 2015-11-16 LAB — POCT URINALYSIS DIPSTICK
BILIRUBIN UA: NEGATIVE
Blood, UA: NEGATIVE
GLUCOSE UA: NEGATIVE
Ketones, UA: NEGATIVE
LEUKOCYTES UA: NEGATIVE
NITRITE UA: NEGATIVE
Spec Grav, UA: 1.025
UROBILINOGEN UA: 0.2
pH, UA: 6

## 2015-11-16 NOTE — Addendum Note (Signed)
Addended by: Rosine BeatLONTZ, Paxon Propes L on: 11/16/2015 04:16 PM   Modules accepted: Orders
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
## 2015-11-16 NOTE — Progress Notes (Signed)
ROB- doing well, denies concerns, glucola next visit; UDS repeated today

## 2015-11-17 LAB — PAIN MGT SCRN (14 DRUGS), UR
AMPHETAMINE SCRN UR: NEGATIVE ng/mL
Barbiturate Screen, Ur: NEGATIVE ng/mL
Benzodiazepine Screen, Urine: NEGATIVE ng/mL
Buprenorphine, Urine: NEGATIVE ng/mL
CANNABINOIDS UR QL SCN: POSITIVE ng/mL
COCAINE(METAB.) SCREEN, URINE: NEGATIVE ng/mL
Creatinine(Crt), U: 308.3 mg/dL — ABNORMAL HIGH (ref 20.0–300.0)
Fentanyl, Urine: NEGATIVE pg/mL
METHADONE SCREEN, URINE: NEGATIVE ng/mL
Meperidine Screen, Urine: NEGATIVE ng/mL
OPIATE SCRN UR: NEGATIVE ng/mL
Oxycodone+Oxymorphone Ur Ql Scn: NEGATIVE ng/mL
PCP SCRN UR: NEGATIVE ng/mL
PROPOXYPHENE SCREEN: NEGATIVE ng/mL
Ph of Urine: 5.9 (ref 4.5–8.9)
TRAMADOL UR QL SCN: NEGATIVE ng/mL

## 2015-12-11 ENCOUNTER — Other Ambulatory Visit: Payer: Self-pay | Admitting: *Deleted

## 2015-12-11 DIAGNOSIS — Z131 Encounter for screening for diabetes mellitus: Secondary | ICD-10-CM

## 2015-12-11 DIAGNOSIS — Z3403 Encounter for supervision of normal first pregnancy, third trimester: Secondary | ICD-10-CM

## 2015-12-15 ENCOUNTER — Encounter: Payer: Medicaid Other | Admitting: Obstetrics and Gynecology

## 2015-12-18 ENCOUNTER — Telehealth: Payer: Self-pay | Admitting: Obstetrics and Gynecology

## 2015-12-18 NOTE — Telephone Encounter (Signed)
Judy Schultz called to reschedule her 4 week rob and 1 hour glucose appointment. She was supposed to be seen on 12/15/15 but cancelled the appointment. The next available appointment was on June 21st at 3:45pm. She's wondering if she needs to wait that long before being seen or if she needs to be worked in sooner. She'd like a phone call regarding this if needed.  Pt's ph# [phone] Thank you.

## 2015-12-18 NOTE — Telephone Encounter (Signed)
Hey I would put her 01/02/16, in between those am annuals

## 2015-12-19 NOTE — Telephone Encounter (Signed)
FORWARDING

## 2015-12-19 NOTE — Telephone Encounter (Signed)
Done.KEC °

## 2016-01-02 ENCOUNTER — Ambulatory Visit (INDEPENDENT_AMBULATORY_CARE_PROVIDER_SITE_OTHER): Payer: Medicaid Other | Admitting: Obstetrics and Gynecology

## 2016-01-02 VITALS — BP 113/68 | HR 87 | Wt 250.1 lb

## 2016-01-02 DIAGNOSIS — Z331 Pregnant state, incidental: Secondary | ICD-10-CM

## 2016-01-02 DIAGNOSIS — Z23 Encounter for immunization: Secondary | ICD-10-CM

## 2016-01-02 DIAGNOSIS — Z3490 Encounter for supervision of normal pregnancy, unspecified, unspecified trimester: Secondary | ICD-10-CM

## 2016-01-02 DIAGNOSIS — Z1389 Encounter for screening for other disorder: Secondary | ICD-10-CM

## 2016-01-02 LAB — POCT URINALYSIS DIPSTICK
BILIRUBIN UA: NEGATIVE
Blood, UA: NEGATIVE
Glucose, UA: NEGATIVE
KETONES UA: NEGATIVE
Nitrite, UA: NEGATIVE
Protein, UA: NEGATIVE
SPEC GRAV UA: 1.025
Urobilinogen, UA: NEGATIVE
pH, UA: 6

## 2016-01-02 NOTE — Progress Notes (Signed)
ROB- doing well, denies any signs of preterm labor. glucola done, Tdap given

## 2016-01-02 NOTE — Patient Instructions (Signed)
Tdap Vaccine (Tetanus, Diphtheria and Pertussis): What You Need to Know 1. Why get vaccinated? Tetanus, diphtheria and pertussis are very serious diseases. Tdap vaccine can protect us from these diseases. And, Tdap vaccine given to pregnant women can protect newborn babies against pertussis. TETANUS (Lockjaw) is rare in the [place] today. It causes painful muscle tightening and stiffness, usually all over the body.  It can lead to tightening of muscles in the head and neck so you can't open your mouth, swallow, or sometimes even breathe. Tetanus kills about 1 out of 10 people who are infected even after receiving the best medical care. DIPHTHERIA is also rare in the [place] today. It can cause a thick coating to form in the back of the throat.  It can lead to breathing problems, heart failure, paralysis, and death. PERTUSSIS (Whooping Cough) causes severe coughing spells, which can cause difficulty breathing, vomiting and disturbed sleep.  It can also lead to weight loss, incontinence, and rib fractures. Up to 2 in 100 adolescents and 5 in 100 adults with pertussis are hospitalized or have complications, which could include pneumonia or death. These diseases are caused by bacteria. Diphtheria and pertussis are spread from person to person through secretions from coughing or sneezing. Tetanus enters the body through cuts, scratches, or wounds. Before vaccines, as many as 200,000 cases of diphtheria, 200,000 cases of pertussis, and hundreds of cases of tetanus, were reported in the [place] each year. Since vaccination began, reports of cases for tetanus and diphtheria have dropped by about 99% and for pertussis by about 80%. 2. Tdap vaccine Tdap vaccine can protect adolescents and adults from tetanus, diphtheria, and pertussis. One dose of Tdap is routinely given at age 11 or 12. People who did not get Tdap at that age should get it as soon as possible. Tdap is especially important  for healthcare professionals and anyone having close contact with a baby younger than 12 months. Pregnant women should get a dose of Tdap during every pregnancy, to protect the newborn from pertussis. Infants are most at risk for severe, life-threatening complications from pertussis. Another vaccine, called Td, protects against tetanus and diphtheria, but not pertussis. A Td booster should be given every 10 years. Tdap may be given as one of these boosters if you have never gotten Tdap before. Tdap may also be given after a severe cut or burn to prevent tetanus infection. Your doctor or the person giving you the vaccine can give you more information. Tdap may safely be given at the same time as other vaccines. 3. Some people should not get this vaccine  A person who has ever had a life-threatening allergic reaction after a previous dose of any diphtheria, tetanus or pertussis containing vaccine, OR has a severe allergy to any part of this vaccine, should not get Tdap vaccine. Tell the person giving the vaccine about any severe allergies.  Anyone who had coma or long repeated seizures within 7 days after a childhood dose of DTP or DTaP, or a previous dose of Tdap, should not get Tdap, unless a cause other than the vaccine was found. They can still get Td.  Talk to your doctor if you:  have seizures or another nervous system problem,  had severe pain or swelling after any vaccine containing diphtheria, tetanus or pertussis,  ever had a condition called Guillain-Barr Syndrome (GBS),  aren't feeling well on the day the shot is scheduled. 4. Risks With any medicine, including vaccines, there is   a chance of side effects. These are usually mild and go away on their own. Serious reactions are also possible but are rare. Most people who get Tdap vaccine do not have any problems with it. Mild problems following Tdap (Did not interfere with activities)  Pain where the shot was given (about 3 in 4  adolescents or 2 in 3 adults)  Redness or swelling where the shot was given (about 1 person in 5)  Mild fever of at least 100.4F (up to about 1 in 25 adolescents or 1 in 100 adults)  Headache (about 3 or 4 people in 10)  Tiredness (about 1 person in 3 or 4)  Nausea, vomiting, diarrhea, stomach ache (up to 1 in 4 adolescents or 1 in 10 adults)  Chills, sore joints (about 1 person in 10)  Body aches (about 1 person in 3 or 4)  Rash, swollen glands (uncommon) Moderate problems following Tdap (Interfered with activities, but did not require medical attention)  Pain where the shot was given (up to 1 in 5 or 6)  Redness or swelling where the shot was given (up to about 1 in 16 adolescents or 1 in 12 adults)  Fever over 102F (about 1 in 100 adolescents or 1 in 250 adults)  Headache (about 1 in 7 adolescents or 1 in 10 adults)  Nausea, vomiting, diarrhea, stomach ache (up to 1 or 3 people in 100)  Swelling of the entire arm where the shot was given (up to about 1 in 500). Severe problems following Tdap (Unable to perform usual activities; required medical attention)  Swelling, severe pain, bleeding and redness in the arm where the shot was given (rare). Problems that could happen after any vaccine:  People sometimes faint after a medical procedure, including vaccination. Sitting or lying down for about 15 minutes can help prevent fainting, and injuries caused by a fall. Tell your doctor if you feel dizzy, or have vision changes or ringing in the ears.  Some people get severe pain in the shoulder and have difficulty moving the arm where a shot was given. This happens very rarely.  Any medication can cause a severe allergic reaction. Such reactions from a vaccine are very rare, estimated at fewer than 1 in a million doses, and would happen within a few minutes to a few hours after the vaccination. As with any medicine, there is a very remote chance of a vaccine causing a serious  injury or death. The safety of vaccines is always being monitored. For more information, visit: www.cdc.gov/vaccinesafety/ 5. What if there is a serious problem? What should I look for?  Look for anything that concerns you, such as signs of a severe allergic reaction, very high fever, or unusual behavior.  Signs of a severe allergic reaction can include hives, swelling of the face and throat, difficulty breathing, a fast heartbeat, dizziness, and weakness. These would usually start a few minutes to a few hours after the vaccination. What should I do?  If you think it is a severe allergic reaction or other emergency that can't wait, call 9-1-1 or get the person to the nearest hospital. Otherwise, call your doctor.  Afterward, the reaction should be reported to the Vaccine Adverse Event Reporting System (VAERS). Your doctor might file this report, or you can do it yourself through the VAERS web site at www.vaers.hhs.gov, or by calling 1-800-822-7967. VAERS does not give medical advice.  6. The National Vaccine Injury Compensation Program The National Vaccine Injury Compensation Program (  VICP) is a federal program that was created to compensate people who may have been injured by certain vaccines. Persons who believe they may have been injured by a vaccine can learn about the program and about filing a claim by calling 1-800-338-2382 or visiting the VICP website at www.hrsa.gov/vaccinecompensation. There is a time limit to file a claim for compensation. 7. How can I learn more?  Ask your doctor. He or she can give you the vaccine package insert or suggest other sources of information.  Call your local or state health department.  Contact the Centers for Disease Control and Prevention (CDC):  Call 1-800-232-4636 (1-800-CDC-INFO) or  Visit CDC's website at www.cdc.gov/vaccines CDC Tdap Vaccine VIS (09/21/13)   This information is not intended to replace advice given to you by your health care  provider. Make sure you discuss any questions you have with your health care provider.   Document Released: 01/14/2012 Document Revised: 08/05/2014 Document Reviewed: 10/27/2013 Elsevier Interactive Patient Education 2016 Elsevier Inc.  

## 2016-01-03 LAB — GLUCOSE, 1 HOUR GESTATIONAL: Gestational Diabetes Screen: 131 mg/dL (ref 65–139)

## 2016-01-03 LAB — HEMOGLOBIN AND HEMATOCRIT, BLOOD
HEMATOCRIT: 34.1 % (ref 34.0–46.6)
HEMOGLOBIN: 11 g/dL — AB (ref 11.1–15.9)

## 2016-01-06 ENCOUNTER — Inpatient Hospital Stay: Payer: Medicaid Other

## 2016-01-06 ENCOUNTER — Inpatient Hospital Stay
Admission: EM | Admit: 2016-01-06 | Discharge: 2016-01-14 | DRG: 775 | Disposition: A | Discharge status: Home / Self Care | Payer: Medicaid Other | Attending: Obstetrics and Gynecology | Admitting: Obstetrics and Gynecology

## 2016-01-06 DIAGNOSIS — Z3480 Encounter for supervision of other normal pregnancy, unspecified trimester: Secondary | ICD-10-CM | POA: Diagnosis not present

## 2016-01-06 DIAGNOSIS — O42919 Preterm premature rupture of membranes, unspecified as to length of time between rupture and onset of labor, unspecified trimester: Secondary | ICD-10-CM

## 2016-01-06 DIAGNOSIS — O09899 Supervision of other high risk pregnancies, unspecified trimester: Secondary | ICD-10-CM

## 2016-01-06 DIAGNOSIS — O4200 Premature rupture of membranes, onset of labor within 24 hours of rupture, unspecified weeks of gestation: Secondary | ICD-10-CM

## 2016-01-06 DIAGNOSIS — O42113 Preterm premature rupture of membranes, onset of labor more than 24 hours following rupture, third trimester: Secondary | ICD-10-CM | POA: Diagnosis present

## 2016-01-06 DIAGNOSIS — Z3A32 32 weeks gestation of pregnancy: Secondary | ICD-10-CM | POA: Diagnosis not present

## 2016-01-06 DIAGNOSIS — O42913 Preterm premature rupture of membranes, unspecified as to length of time between rupture and onset of labor, third trimester: Secondary | ICD-10-CM | POA: Diagnosis present

## 2016-01-06 DIAGNOSIS — O09219 Supervision of pregnancy with history of pre-term labor, unspecified trimester: Secondary | ICD-10-CM

## 2016-01-06 DIAGNOSIS — Z87891 Personal history of nicotine dependence: Secondary | ICD-10-CM

## 2016-01-06 DIAGNOSIS — O4100X0 Oligohydramnios, unspecified trimester, not applicable or unspecified: Secondary | ICD-10-CM | POA: Diagnosis present

## 2016-01-06 DIAGNOSIS — O411230 Chorioamnionitis, third trimester, not applicable or unspecified: Secondary | ICD-10-CM | POA: Diagnosis present

## 2016-01-06 DIAGNOSIS — O09893 Supervision of other high risk pregnancies, third trimester: Secondary | ICD-10-CM

## 2016-01-06 DIAGNOSIS — O09213 Supervision of pregnancy with history of pre-term labor, third trimester: Secondary | ICD-10-CM

## 2016-01-06 DIAGNOSIS — O4210 Premature rupture of membranes, onset of labor more than 24 hours following rupture, unspecified weeks of gestation: Secondary | ICD-10-CM

## 2016-01-06 LAB — TYPE AND SCREEN
ABO/RH(D): A POS
Antibody Screen: NEGATIVE

## 2016-01-06 LAB — URINALYSIS COMPLETE WITH MICROSCOPIC (ARMC ONLY)
BILIRUBIN URINE: NEGATIVE
GLUCOSE, UA: NEGATIVE mg/dL
Hgb urine dipstick: NEGATIVE
Nitrite: NEGATIVE
Protein, ur: 30 mg/dL — AB
Specific Gravity, Urine: 1.027 (ref 1.005–1.030)
pH: 5 (ref 5.0–8.0)

## 2016-01-06 LAB — CREATININE, SERUM: Creatinine, Ser: 0.52 mg/dL (ref 0.44–1.00)

## 2016-01-06 LAB — CBC
HEMATOCRIT: 31.6 % — AB (ref 35.0–47.0)
HEMOGLOBIN: 10.5 g/dL — AB (ref 12.0–16.0)
MCH: 26 pg (ref 26.0–34.0)
MCHC: 33.1 g/dL (ref 32.0–36.0)
MCV: 78.7 fL — AB (ref 80.0–100.0)
Platelets: 242 10*3/uL (ref 150–440)
RBC: 4.02 MIL/uL (ref 3.80–5.20)
RDW: 13.9 % (ref 11.5–14.5)
WBC: 11.3 10*3/uL — ABNORMAL HIGH (ref 3.6–11.0)

## 2016-01-06 LAB — RAPID HIV SCREEN (HIV 1/2 AB+AG)
HIV 1/2 ANTIBODIES: NONREACTIVE
HIV-1 P24 Antigen - HIV24: NONREACTIVE

## 2016-01-06 LAB — CHLAMYDIA/NGC RT PCR (ARMC ONLY)
CHLAMYDIA TR: NOT DETECTED
N GONORRHOEAE: NOT DETECTED

## 2016-01-06 MED ORDER — ACETAMINOPHEN 325 MG PO TABS
650.0000 mg | ORAL_TABLET | ORAL | Status: DC | PRN
  Administered 2016-01-11 – 2016-01-13 (×6): 650 mg via ORAL
  Filled 2016-01-06 (×5): qty 2

## 2016-01-06 MED ORDER — ZOLPIDEM TARTRATE 5 MG PO TABS
5.0000 mg | ORAL_TABLET | Freq: Every evening | ORAL | Status: DC | PRN
  Administered 2016-01-12: 5 mg via ORAL
  Filled 2016-01-06: qty 1

## 2016-01-06 MED ORDER — DOCUSATE SODIUM 100 MG PO CAPS
100.0000 mg | ORAL_CAPSULE | Freq: Every day | ORAL | Status: DC
  Administered 2016-01-06 – 2016-01-12 (×6): 100 mg via ORAL
  Filled 2016-01-06 (×6): qty 1

## 2016-01-06 MED ORDER — TERBUTALINE SULFATE 1 MG/ML IJ SOLN
0.2500 mg | Freq: Once | INTRAMUSCULAR | Status: AC | PRN
  Administered 2016-01-12: 0.25 mg via SUBCUTANEOUS
  Filled 2016-01-06: qty 1

## 2016-01-06 MED ORDER — AMOXICILLIN 500 MG PO CAPS
500.0000 mg | ORAL_CAPSULE | Freq: Three times a day (TID) | ORAL | Status: DC
  Administered 2016-01-08 – 2016-01-09 (×4): 500 mg via ORAL
  Filled 2016-01-06 (×6): qty 1

## 2016-01-06 MED ORDER — PRENATAL MULTIVITAMIN CH
1.0000 | ORAL_TABLET | Freq: Every day | ORAL | Status: DC
  Administered 2016-01-09 – 2016-01-11 (×3): 1 via ORAL
  Filled 2016-01-06 (×8): qty 1

## 2016-01-06 MED ORDER — SODIUM CHLORIDE 0.9 % IV SOLN
2.0000 g | Freq: Four times a day (QID) | INTRAVENOUS | Status: AC
  Administered 2016-01-06 – 2016-01-08 (×8): 2 g via INTRAVENOUS
  Filled 2016-01-06 (×8): qty 2000

## 2016-01-06 MED ORDER — LACTATED RINGERS IV SOLN
INTRAVENOUS | Status: DC
  Administered 2016-01-06 – 2016-01-13 (×14): via INTRAVENOUS

## 2016-01-06 MED ORDER — ENOXAPARIN SODIUM 40 MG/0.4ML ~~LOC~~ SOLN
40.0000 mg | SUBCUTANEOUS | Status: DC
  Administered 2016-01-06 – 2016-01-08 (×3): 40 mg via SUBCUTANEOUS
  Filled 2016-01-06 (×3): qty 0.4

## 2016-01-06 MED ORDER — AZITHROMYCIN 250 MG PO TABS
500.0000 mg | ORAL_TABLET | Freq: Every day | ORAL | Status: AC
  Administered 2016-01-06 – 2016-01-12 (×7): 500 mg via ORAL
  Filled 2016-01-06 (×7): qty 2

## 2016-01-06 MED ORDER — CALCIUM CARBONATE ANTACID 500 MG PO CHEW: 2.0000 | CHEWABLE_TABLET | ORAL | Status: DC | PRN

## 2016-01-06 MED ORDER — BETAMETHASONE SOD PHOS & ACET 6 (3-3) MG/ML IJ SUSP
12.0000 mg | INTRAMUSCULAR | Status: AC
  Administered 2016-01-06 – 2016-01-07 (×2): 12 mg via INTRAMUSCULAR
  Filled 2016-01-06: qty 2
  Filled 2016-01-06: qty 1

## 2016-01-06 NOTE — Plan of Care (Signed)
Problem: Coping: Goal: Level of anxiety will decrease Outcome: Progressing Denies Fear/Anxiety  Problem: Pain Management: Goal: Relief or control of pain will improve Outcome: Progressing Denies pain

## 2016-01-06 NOTE — OB Triage Note (Signed)
Patient reports feeling a gush of water going down her legs at 1430. Reports postive FM.

## 2016-01-06 NOTE — H&P (Signed)
Obstetric History and Physical  Murl Zogg is a 30 y.o. G2P0101 with IUP at [redacted]w[redacted]d presenting for leakage of fluid down leg since 1430.Marland Kitchen Patient states she has been having  no contractions, none vaginal bleeding, ruptured, clear fluid membranes, with active fetal movement.    Prenatal Course Source of Care: [facility] with onset of care at 13 weeks (Sees Melody [place], midwife) Pregnancy complications or risks:  Patient Active Problem List   Diagnosis Date Noted  . Preterm premature rupture of membranes 01/06/2016  . H/O preterm delivery, currently pregnant 01/06/2016  . Closed fracture of shaft of left radius and ulna 06/23/2013  . Radius/ulna fracture 06/23/2013     Prenatal labs and studies: ABO, Rh: A/Positive/-- (01/05 1114) Antibody: Negative (01/05 1114) Rubella: <20.0 (01/05 1114) RPR: Non Reactive (01/05 1114)  HBsAg: Negative (01/05 1114)  HIV: Non Reactive (01/05 1114)  GBS: unknown 1 hr Glucola  normal Genetic screening declined Anatomy US normal   Obstetric History   G2   P1   T0   P1   A0   TAB0   SAB0   E0   M0   L1     # Outcome Date GA Lbr Len/2nd Weight Sex Delivery Anes PTL Lv  2 Current           1 Preterm 05/12/09 [redacted]w[redacted]d  5 lb 2 oz (2.325 kg) F Vag-Spont  N Y     Complications: PROM (premature rupture of membranes)      Past Medical History  Diagnosis Date  . Personal history of asthma     Past Surgical History  Procedure Laterality Date  . Orif ulnar fracture Left 06/23/2013    Procedure: OPEN REDUCTION INTERNAL FIXATION (ORIF) ULNAR/Radius FRACTURE;  Surgeon: Harvie Junior, MD;  Location: [facility] OR;  Service: Orthopedics;  Laterality: Left;    History reviewed. No pertinent family history.   Social History   Social History  . Marital Status: Married    Spouse Name: N/A  . Number of Children: N/A  . Years of Education: N/A   Occupational History  . homemaker    Social History Main Topics  . Smoking status: Former Games developer   . Smokeless tobacco: Never Used  . Alcohol Use: No  . Drug Use: No  . Sexual Activity:    Partners: Male   Other Topics Concern  . None   Social History Narrative    Prescriptions prior to admission  Medication Sig Dispense Refill Last Dose  . Prenat-FeFum-FePo-FA-Omega 3 (CONCEPT DHA) 53.5-38-1 MG CAPS Take 1 tablet by mouth daily. 30 capsule 11 Taking    No Known Allergies  Review of Systems: Negative except for what is mentioned in HPI.  Physical Exam: BP 125/59 mmHg  Pulse 92  Temp(Src) 98.3 F (36.8 C) (Oral)  Resp 18  LMP 05/27/2015 (Approximate) CONSTITUTIONAL: Well-developed, well-nourished female in no acute distress.  HENT:  Normocephalic, atraumatic, External right and left ear normal. Oropharynx is clear and moist EYES: Conjunctivae and EOM are normal. Pupils are equal, round, and reactive to light. No scleral icterus.  NECK: Normal range of motion, supple, no masses SKIN: Skin is warm and dry. No rash noted. Not diaphoretic. No erythema. No pallor. NEUROLOGIC: Alert and oriented to person, place, and time. Normal reflexes, muscle tone coordination. No cranial nerve deficit noted. PSYCHIATRIC: Normal mood and affect. Normal behavior. Normal judgment and thought content. CARDIOVASCULAR: Normal heart rate noted, regular rhythm RESPIRATORY: Effort and breath sounds normal, no problems  with respiration noted ABDOMEN: Soft, nontender, nondistended, gravid. MUSCULOSKELETAL: Normal range of motion. No edema and no tenderness. 2+ distal pulses.  Cervical Exam: Dilatation 0cm   Effacement 80%   Station -2. Grossly ruptured. + Nitrazine. Presentation:  FHT:  Baseline rate 150 bpm   Variability moderate  Accelerations present   Decelerations none Contractions: None   Pertinent Labs/Studies:   Results for orders placed or performed during the hospital encounter of 01/06/16 (from the past 24 hour(s))  Type and screen [facility]     Status: None  (Preliminary result)   Collection Time: 01/06/16  4:24 PM  Result Value Ref Range   ABO/RH(D) PENDING    Antibody Screen PENDING    Sample Expiration 01/09/2016   CBC     Status: Abnormal   Collection Time: 01/06/16  4:24 PM  Result Value Ref Range   WBC 11.3 (H) 3.6 - 11.0 K/uL   RBC 4.02 3.80 - 5.20 MIL/uL   Hemoglobin 10.5 (L) 12.0 - 16.0 g/dL   HCT 11.931.6 (L) 14.735.0 - 82.947.0 %   MCV 78.7 (L) 80.0 - 100.0 fL   MCH 26.0 26.0 - 34.0 pg   MCHC 33.1 32.0 - 36.0 g/dL   RDW 56.213.9 13.011.5 - 86.514.5 %   Platelets 242 150 - 440 K/uL  Creatinine, serum     Status: None   Collection Time: 01/06/16  4:24 PM  Result Value Ref Range   Creatinine, Ser 0.52 0.44 - 1.00 mg/dL   GFR calc non Af Amer >60 >60 mL/min   GFR calc Af Amer >60 >60 mL/min  Rapid HIV screen (HIV 1/2 Ab+Ag)     Status: None   Collection Time: 01/06/16  4:24 PM  Result Value Ref Range   HIV-1 P24 Antigen - HIV24 NON REACTIVE NON REACTIVE   HIV 1/2 Antibodies NON REACTIVE NON REACTIVE   Interpretation (HIV Ag Ab)      A non reactive test result means that HIV 1 or HIV 2 antibodies and HIV 1 p24 antigen were not detected in the specimen.     Imaging (01/06/16):  Preliminary report: cephalic presentation, FHR 144 bpm,  anterior placenta remote from cervix. EGA 33.0 weeks, EDD 02/24/16, EGA 1960 grams (4 lb 5 oz), 51%ile. AFI 4.3 cm (oligohydramnios).   Assessment : Carmelina PaddockJasmine Biven is a 30 y.o. G2P0101 at 5061w5d being admitted for PPROM. H/o prior preterm delivery x 1 (due to PPROM).   Plan: 1. Admit to service.  Patient currently not contracting. If after 2 hours patient still not contracting, can move to antepartum floor.  Will continue to monitor fo contractions.  3. Antibiotics for latency (ampicillin/amoxicillin and azithromycin x 7 days) 4. To administer course of antenatal steroids 5. Neonatology consult 6. NSTs q shift 7. Lovenox 40 mg daily for DVT prophylaxis 8. Regular diet 9. Bedrest with bathroom privileges.  10. Will  repeat scan after 1 week to reassess AFI.    Hildred LaserAnika Lemonte Al, MD [facility]

## 2016-01-07 LAB — RUBELLA SCREEN

## 2016-01-07 LAB — RPR: RPR: NONREACTIVE

## 2016-01-07 LAB — VARICELLA ZOSTER ANTIBODY, IGG: VARICELLA IGG: 1700 {index} (ref >165)

## 2016-01-07 NOTE — Clinical Social Work Note (Signed)
Clinical Social Work Assessment  Patient Details  Name: Judy Schultz MRN: [id number] Date of Birth: 17-Mar-1986  Date of referral:  01/07/16               Reason for consult:  Discharge Planning                Permission sought to share information with:  Family Supports Permission granted to share information::  Yes, Verbal Permission Granted  Name::        Agency::     Relationship::   (Husband)  Contact Information:     Housing/Transportation Living arrangements for the past 2 months:  Single Family Home Source of Information:  Patient Patient Interpreter Needed:  None Criminal Activity/Legal Involvement Pertinent to Current Situation/Hospitalization:  No - Comment as needed Significant Relationships:  Spouse, Other Family Members Lives with:  Spouse Do you feel safe going back to the place where you live?  Yes Need for family participation in patient care:  No (Coment)  Care giving concerns:  Patient has a 19 year old child and will be in the hospital for an extend time due to a premature rupture of membrane.    Social Worker assessment / plan:  CSW met with patient at bedside. Introduced herself and her role. Per patient she is married and this is her second child. Reported that she was admitted into the hospital due to her water breaking early. Stated that she feels fine and has not experienced any contractions. Reported that she's excited about her little one. Informed CSW that she currently lives in [place]. Reported she recently moved there because her husband is from [place]. Stated that her 4 year old child is staying with her father-in-law. Stated that her child is "in good hands". Stated that her family knows that she will be in the hospital for an extended time and are prepared to care for her child while she's away. There are no CSW needs at this time. CSW is signing off but is available if a CSW need were to arise. Patient has a safety plan for her child.   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 Stated she's willing to do anything to ensure her child is healthy.   Patient/Family's Understanding of and Emotional Response to Diagnosis, Current Treatment, and Prognosis:  Patient reported that she is okay. Stated that the staff is taking good care of her and is apperceive of CSW assistance.   Emotional Assessment Appearance:  Appears stated age Attitude/Demeanor/Rapport:   (None) Affect (typically observed):  Accepting, Calm, Pleasant Orientation:  Oriented to Self, Oriented to Place, Oriented to  Time, Oriented to Situation Alcohol / Substance use:  Not Applicable Psych involvement (Current and /or in the community):  No (Comment)  Discharge Needs  Concerns to be addressed:  Discharge Planning Concerns Readmission within the last 30 days:  No Current discharge risk:  None Barriers to Discharge:  No Barriers Identified   [place], LCSW 01/07/2016, 5:19 PM

## 2016-01-07 NOTE — Progress Notes (Signed)
Pt. Back to room. B. Thea SilversmithSpielman RN states strip was reactive and that she reported results to [facility] CN[facility]

## 2016-01-07 NOTE — Progress Notes (Signed)
Sheila OatsB. Spielman RN transported Pt. To L&D for NST.

## 2016-01-07 NOTE — Progress Notes (Signed)
  Antenatal Progress Note  Subjective:     Patient ID: Judy Schultz is a 30 y.o. female 4382w6d, Estimated Date of Delivery: 03/04/16 by Patient's last menstrual period was 05/27/2015 (approximate). who was admitted for PPROM.  HD# 2.   Subjective:  Patient denies complaints today.  Tolerating diet.  Notes she is still leaking small amount of clear fluid occasionally, but not heavy.    Review of Systems Denies contractions, vaginal bleeding, and reports good fetal movement.     Objective:   Filed Vitals:   01/06/16 2027 01/07/16 0009 01/07/16 0421 01/07/16 0749  BP: 130/64 131/64 127/68 128/63  Pulse: 86 81 73 81  Temp: 98 F (36.7 C) 97.5 F (36.4 C) 97.9 F (36.6 C) 98.2 F (36.8 C)  TempSrc: Oral Oral Oral Oral  Resp: 18 20 20 20   SpO2: 100% 100% 100% 100%    General appearance: alert and no distress Lungs: clear to auscultation bilaterally Heart: regular rate and rhythm, S1, S2 normal, no murmur, click, rub or gallop Abdomen: gravid, soft, non-tender; bowel sounds normal; no masses,  no organomegaly Pelvic: deferred Extremities: extremities normal, atraumatic, no cyanosis or edema   FHT: baseline 140 bpm, accels present, decels absent.  Variability: moderate Toco: infrequent contractions   Labs:  Lab Results  Component Value Date   ABORH A POS 01/06/2016   Lab Results  Component Value Date   WBC 11.3* 01/06/2016   HGB 10.5* 01/06/2016   HCT 31.6* 01/06/2016   MCV 78.7* 01/06/2016   PLT 242 01/06/2016      Assessment:  30 y.o. female 1082w6d, Estimated Date of Delivery: 03/04/16 with:    Patient Active Problem List   Diagnosis Date Noted  . Preterm premature rupture of membranes 01/06/2016  . H/O preterm delivery, currently pregnant 01/06/2016  . Oligohydramnios 01/06/2016    Plan:   1. Continue antibiotics for latency (ampicillin/amoxicillin and azithromycin x 7 days).  On Day #2. 2. For second dose of antenatal steroids at approximately 5 pm 3. For  Neonatology consult 4. For Social work consult for long-term stay 5. Continue NST q shift 6. Continue daily DVT prophylaxis with Lovenox 7. Continue bedrest with bathroom privileges. May be have 5-10 min outside breaks in wheelchair.  6. Regular diet   Hildred LaserAnika Aaralynn Shepheard, MD [facility]

## 2016-01-08 LAB — CULTURE, BETA STREP (GROUP B ONLY)

## 2016-01-08 NOTE — Progress Notes (Signed)
  Antenatal Progress Note  Subjective:     Patient ID: Judy Schultz is a 30 y.o. female 6178w0d, Estimated Date of Delivery: 03/04/16 by Patient's last menstrual period was 05/27/2015 (approximate). who was admitted for PPROM.  HD# 3.   Subjective:  Patient denies complaints today.  Tolerating diet.  Notes she is still leaking small amount of clear fluid occasionally, but not heavy.    Review of Systems Denies contractions, vaginal bleeding, fevers, and reports good fetal movement.     Objective:   Filed Vitals:   01/07/16 2055 01/07/16 2356 01/08/16 0353 01/08/16 0815  BP: 116/55 119/66 118/54 115/63  Pulse: 77 81 67 72  Temp: 98.3 F (36.8 C) 97.7 F (36.5 C) 97.6 F (36.4 C) 97.9 F (36.6 C)  TempSrc: Oral Oral Oral Oral  Resp: 20 18 20 18   SpO2: 100% 100%  100%    General appearance: alert and no distress Lungs: clear to auscultation bilaterally Heart: regular rate and rhythm, S1, S2 normal, no murmur, click, rub or gallop Abdomen: gravid, soft, non-tender; bowel sounds normal; no masses,  no organomegaly. FHT 152 on doppler. Pelvic: deferred Extremities: extremities normal, atraumatic, no cyanosis or edema   NST INTERPRETATION (01/07/16 evening NST): Indications: rule out uterine contractions and PPROM with oligohydramnios  Mode: Doppler Baseline Rate (A): 148 bpm Variability: Moderate Accelerations: 15 x 15 Decelerations: None     Contraction Frequency (min): Abd. is soft and Pt. Denies C/O  Impression: reactive   Labs:  Lab Results  Component Value Date   ABORH A POS 01/06/2016   Lab Results  Component Value Date   WBC 11.3* 01/06/2016   HGB 10.5* 01/06/2016   HCT 31.6* 01/06/2016   MCV 78.7* 01/06/2016   PLT 242 01/06/2016      Assessment:  30 y.o. female 6797w6d, Estimated Date of Delivery: 03/04/16 with:    Patient Active Problem List   Diagnosis Date Noted  . Preterm premature rupture of membranes 01/06/2016  . H/O preterm delivery, currently  pregnant 01/06/2016  . Oligohydramnios 01/06/2016    Plan:   1. Continue antibiotics for latency (now transitioned to amoxicillin and azithromycin PO x 5 days, s/p 2 days of IV antibiotics).  On Day #3 of 7 of total antibiotic course. 2. Is s/p course of antenatal steroids.  3. Pending Neonatology consult 4. S/p Social work consult for long-term stay 5. Continue NST q shift 6. Continue daily DVT prophylaxis with Lovenox 7. Continue bedrest with bathroom privileges. May be have 5-10 min outside breaks in wheelchair.  6. Regular diet 7. Will repeat AFI once antibiotics completed.   Hildred LaserAnika Aubrey Blackard, MD [facility]

## 2016-01-08 NOTE — Progress Notes (Addendum)
Pt to BP for 30 min fetal monitoring per order.   Reported fetal monitoring to Dr. Valentino Saxonherry.  May go back to MB room.

## 2016-01-08 NOTE — Progress Notes (Signed)
   01/08/16 0950  Clinical Encounter Type  Visited With Patient and family together  Visit Type Initial  Referral From Other (Comment) (meet the patient while walking with family)  Consult/Referral To None  Spiritual Encounters  Spiritual Needs Other (Comment) (Patient identified no need through conversation)  Stress Factors  Patient Stress Factors Health changes  I met with the patient and her family while they were on a walk in labor and delivery.  Patient seemed to be in good spirits.

## 2016-01-09 MED ORDER — ENOXAPARIN SODIUM 40 MG/0.4ML ~~LOC~~ SOLN
40.0000 mg | SUBCUTANEOUS | Status: DC
  Administered 2016-01-10 – 2016-01-12 (×3): 40 mg via SUBCUTANEOUS
  Filled 2016-01-09 (×4): qty 0.4

## 2016-01-09 MED ORDER — AMOXICILLIN 250 MG/5ML PO SUSR
500.0000 mg | Freq: Three times a day (TID) | ORAL | Status: AC
  Administered 2016-01-10 – 2016-01-12 (×9): 500 mg via ORAL
  Filled 2016-01-09 (×11): qty 10

## 2016-01-09 MED ORDER — ENOXAPARIN SODIUM 40 MG/0.4ML ~~LOC~~ SOLN
40.0000 mg | Freq: Two times a day (BID) | SUBCUTANEOUS | Status: DC
  Administered 2016-01-09: 40 mg via SUBCUTANEOUS
  Filled 2016-01-09 (×2): qty 0.4

## 2016-01-09 NOTE — Progress Notes (Signed)
Anticoagulation monitoring(Lovenox):  30 yo F ordered Lovenox 40 mg Q24h  Filed Weights   01/09/16 0830  Weight: 264 lb 6 oz (119.92 kg)   BMI 40.2   Lab Results  Component Value Date   CREATININE 0.52 01/06/2016   CREATININE 0.78 06/23/2013   Estimated Creatinine Clearance: 142.6 mL/min (by C-G formula based on Cr of 0.52). Hemoglobin & Hematocrit     Component Value Date/Time   HGB 10.5* 01/06/2016 1624   HCT 31.6* 01/06/2016 1624   HCT 34.1 01/02/2016 0915     Per Protocol for Patient with estCrcl > 30 ml/min and BMI > 40, will transition to Lovenox 40 mg Q12h.      Bari MantisKristin Dorman Calderwood PharmD Clinical Pharmacist 01/09/2016

## 2016-01-09 NOTE — Progress Notes (Signed)
[facility] 30 minute fetal monitoring per MD order. Patient denies contractions. Patient transferred back to Mother baby.

## 2016-01-09 NOTE — Progress Notes (Signed)
RN transferred pt to L&D via wheelchair for her "30 minute fetal monitoring"; this is to be done once per shift

## 2016-01-09 NOTE — Progress Notes (Addendum)
  Antenatal Progress Note  Subjective:     Patient ID: Carmelina PaddockJasmine Oyola is a 30 y.o. female 2962w1d, Estimated Date of Delivery: 03/04/16 by Patient's last menstrual period was 05/27/2015 (approximate). who was admitted for PPROM.  HD# 4.   Subjective:  Patient denies complaints today.  Feeling tired today as NST was completed at approximately 4 a.m.  Notes she is still leaking small amount of clear fluid occasionally, but not heavy.    Review of Systems Denies contractions, vaginal bleeding, fevers, and reports good fetal movement.     Objective:   Filed Vitals:   01/08/16 1642 01/08/16 1741 01/08/16 2033 01/09/16 0155  BP: 117/60 130/70 142/73 121/70  Pulse: 72 82 90 70  Temp: 98.4 F (36.9 C) 98.2 F (36.8 C) 98 F (36.7 C) 97.6 F (36.4 C)  TempSrc: Oral Oral Oral Oral  Resp: 18 18 18 18   SpO2:   100% 100%    General appearance: alert and no distress Lungs: clear to auscultation bilaterally Heart: regular rate and rhythm, S1, S2 normal, no murmur, click, rub or gallop Abdomen: gravid, soft, non-tender; bowel sounds normal; no masses,  no organomegaly. FHT 152 on doppler. Pelvic: deferred Extremities: extremities normal, atraumatic, no cyanosis or edema   NST INTERPRETATION (01/08/16 evening NST): Indications: rule out uterine contractions and PPROM with oligohydramnios  Mode: External Baseline Rate (A): 165 bpm (FHT) Variability: Moderate Accelerations: 15 x 15 Decelerations: Variable     Contraction Frequency (min): 0  Impression: reactive   Labs:  Lab Results  Component Value Date   ABORH A POS 01/06/2016   Lab Results  Component Value Date   WBC 11.3* 01/06/2016   HGB 10.5* 01/06/2016   HCT 31.6* 01/06/2016   MCV 78.7* 01/06/2016   PLT 242 01/06/2016      Assessment:  30 y.o. female 6762w1d, Estimated Date of Delivery: 03/04/16 with:    Patient Active Problem List   Diagnosis Date Noted  . Preterm premature rupture of membranes 01/06/2016  . H/O  preterm delivery, currently pregnant 01/06/2016  . Oligohydramnios 01/06/2016    Plan:   1. Continue antibiotics for latency (now transitioned to amoxicillin and azithromycin PO x 5 days, s/p 2 days of IV antibiotics).  On Day #4 of 7 of total antibiotic course. 2. Is s/p course of antenatal steroids.  3. For Neonatology consult if anticipating delivery 4. S/p Social work consult for long-term stay 5. Continue NST q shift 6. Continue daily DVT prophylaxis with Lovenox 7. Continue bedrest with bathroom privileges. May be have 5-10 min outside breaks in wheelchair.  6. Regular diet 7. Will repeat AFI once antibiotics completed.   Hildred LaserAnika Raquel Racey, MD [facility]

## 2016-01-10 LAB — CREATININE, SERUM
CREATININE: 0.54 mg/dL (ref 0.44–1.00)
GFR calc Af Amer: >60 mL/min (ref >60)
GFR calc non Af Amer: >60 mL/min (ref >60)

## 2016-01-10 LAB — TYPE AND SCREEN
ABO/RH(D): A POS
Antibody Screen: NEGATIVE

## 2016-01-10 NOTE — Progress Notes (Signed)
  Antenatal Progress Note  Subjective:     Patient ID: Judy PaddockJasmine Schultz is a 30 y.o. female 5150w2d, Estimated Date of Delivery: 03/04/16 by Patient's last menstrual period was 05/27/2015 (approximate). who was admitted for PPROM.  HD# 4.   Subjective:  Patient denies complaints today.  Notes she is still leaking small amount of clear fluid occasionally, but not heavy.    Review of Systems Denies contractions, vaginal bleeding, fevers, and reports good fetal movement.     Objective:   Filed Vitals:   01/09/16 0830 01/09/16 1536 01/09/16 1923 01/09/16 1935  BP: 126/69 109/50 130/60 125/58  Pulse: 72 70 86 79  Temp: 98.4 F (36.9 C) 98.5 F (36.9 C) 98.3 F (36.8 C) 98.3 F (36.8 C)  TempSrc: Oral Oral Oral Oral  Resp: 18 20 18 16   Height: 5\' 8"  (1.727 m)     Weight: 264 lb 6 oz (119.92 kg)     SpO2: 100%  100%     General appearance: alert and no distress Lungs: clear to auscultation bilaterally Heart: regular rate and rhythm, S1, S2 normal, no murmur, click, rub or gallop Abdomen: gravid, soft, non-tender; bowel sounds normal; no masses,  no organomegaly. FHT 152 on doppler. Pelvic: deferred Extremities: extremities normal, atraumatic, no cyanosis or edema   NST INTERPRETATION (01/09/16 evening NST): Indications: rule out uterine contractions and PPROM with oligohydramnios  Mode: External Baseline Rate (A): 150 bpm Variability: Moderate Accelerations: 15 x 15 Decelerations: None     Contraction Frequency (min): none  Impression: reactive   Labs:  Lab Results  Component Value Date   ABORH PENDING 01/10/2016   Lab Results  Component Value Date   WBC 11.3* 01/06/2016   HGB 10.5* 01/06/2016   HCT 31.6* 01/06/2016   MCV 78.7* 01/06/2016   PLT 242 01/06/2016      Assessment:  30 y.o. female 7650w2d, Estimated Date of Delivery: 03/04/16 with:    Patient Active Problem List   Diagnosis Date Noted  . Preterm premature rupture of membranes 01/06/2016  . H/O preterm  delivery, currently pregnant 01/06/2016  . Oligohydramnios 01/06/2016    Plan:   1. Continue antibiotics for latency (now transitioned to amoxicillin and azithromycin PO x 5 days, s/p 2 days of IV antibiotics).  On Day #5 of 7 of total antibiotic course. 2. Is s/p course of antenatal steroids.  3. For Neonatology consult if anticipating delivery 4. S/p Social work consult for long-term stay 5. Continue NST q shift 6. Continue daily DVT prophylaxis with Lovenox 7. Continue bedrest with bathroom privileges. May be have 5-10 min outside breaks in wheelchair.  8. Regular diet 9. Will repeat AFI once antibiotics completed.  10. Will get MFM consult in a.m.   Hildred LaserAnika Sabel Hornbeck, MD [facility]
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
## 2016-01-11 NOTE — Progress Notes (Signed)
[facility] and patient information given to Gala LewandowskyJessica Brown RN.

## 2016-01-11 NOTE — Progress Notes (Signed)
  Antenatal Progress Note  Subjective:     Patient ID: Judy PaddockJasmine Schultz is a 30 y.o. female 725w3d, Estimated Date of Delivery: 03/04/16 by Patient's last menstrual period was 05/27/2015 (approximate). who was admitted for PPROM.  HD# 5.   Subjective:  Patient denies complaints today.  Notes she is still leaking small amount of clear fluid occasionally, but not heavy.    Review of Systems Denies contractions, vaginal bleeding, fevers, and reports good fetal movement.     Objective:   Filed Vitals:   01/10/16 1616 01/10/16 1944 01/11/16 0027 01/11/16 0433  BP: 115/61 114/62 135/63 132/71  Pulse: 84 92 78 76  Temp: 98.9 F (37.2 C) 97.9 F (36.6 C) 98.2 F (36.8 C) 98.4 F (36.9 C)  TempSrc:  Oral Oral Oral  Resp: 18 18 16 20   Height:      Weight:      SpO2:  100% 100% 100%    General appearance: alert and no distress Lungs: clear to auscultation bilaterally Heart: regular rate and rhythm, S1, S2 normal, no murmur, click, rub or gallop Abdomen: gravid, soft, non-tender; bowel sounds normal; no masses,  no organomegaly. FHT 152 on doppler. Pelvic: deferred Extremities: extremities normal, atraumatic, no cyanosis or edema   NST INTERPRETATION (01/11/16 morning NST): Indications: rule out uterine contractions and PPROM with oligohydramnios  Mode: External Baseline Rate (A): 145 bpm Variability: Moderate Accelerations: 10 x 10 Decelerations: None     Contraction Frequency (min): no ctx noted  Impression: reactive   Labs:  Lab Results  Component Value Date   ABORH A POS 01/10/2016   Lab Results  Component Value Date   WBC 11.3* 01/06/2016   HGB 10.5* 01/06/2016   HCT 31.6* 01/06/2016   MCV 78.7* 01/06/2016   PLT 242 01/06/2016      Assessment:  30 y.o. female 4224w2d, Estimated Date of Delivery: 03/04/16 with:    Patient Active Problem List   Diagnosis Date Noted  . Preterm premature rupture of membranes 01/06/2016  . H/O preterm delivery, currently pregnant  01/06/2016  . Oligohydramnios 01/06/2016    Plan:   1. Continue antibiotics for latency (now transitioned to amoxicillin and azithromycin PO x 5 days, s/p 2 days of IV antibiotics).  On Day #5 of 7 of total antibiotic course. 2. Is s/p course of antenatal steroids.  3. For Neonatology consult if anticipating delivery 4. S/p Social work consult for long-term stay 5. Continue NST q shift 6. Continue daily DVT prophylaxis with Lovenox 7. Continue bedrest with bathroom privileges. May be have 5-10 min outside breaks in wheelchair.  8. Regular diet 9. Will repeat AFI once antibiotics completed.  10. Will get MFM consult in a.m.   Hildred LaserAnika Kaelan Emami, MD [facility]

## 2016-01-12 MED ORDER — TERBUTALINE SULFATE 1 MG/ML IJ SOLN
0.2500 mg | Freq: Once | INTRAMUSCULAR | Status: AC
  Administered 2016-01-12: 0.25 mg via SUBCUTANEOUS

## 2016-01-12 MED ORDER — TERBUTALINE SULFATE 1 MG/ML IJ SOLN
INTRAMUSCULAR | Status: AC
  Administered 2016-01-12: 0.25 mg via SUBCUTANEOUS
  Filled 2016-01-12: qty 1

## 2016-01-12 MED ORDER — SODIUM CHLORIDE FLUSH 0.9 % IV SOLN
INTRAVENOUS | Status: AC
  Filled 2016-01-12: qty 20

## 2016-01-12 MED ORDER — NIFEDIPINE 10 MG PO CAPS
10.0000 mg | ORAL_CAPSULE | Freq: Four times a day (QID) | ORAL | Status: DC
  Administered 2016-01-12 – 2016-01-13 (×2): 10 mg via ORAL
  Filled 2016-01-12 (×2): qty 1

## 2016-01-12 NOTE — Progress Notes (Signed)
Paged Dr.Cherry to notify her of patient "contracting " every 5-10 minutes for the past hour.  tylenol given per pt request at 1306 no relieve an hour later.  Per MD pt transferred to OBS 1 for further evaluation no new orders at this time.

## 2016-01-12 NOTE — Progress Notes (Signed)
Carmelina PaddockJasmine Montoya is a 30 y.o. G2P0101 at 5843w4d by LMP admitted for PROM 6 days ago (see previous notes), reported onset of contractions this PM and returned to L&D for monitoring, found to have regular contractions q2-54min, mild to palpation, IVF bolus and 3 doses terbutaline given with resolution. Dr Valentino Saxonherry consulted on plan of care.  Subjective: Reports contractions less noticable after 3rd terbutaline dose, no sleeping.  Objective: BP 124/65 mmHg  Pulse 105  Temp(Src) 99.2 F (37.3 C) (Oral)  Resp 20  Ht 5\' 8"  (1.727 m)  Wt 264 lb 6 oz (119.92 kg)  BMI 40.21 kg/m2  SpO2 100%  LMP 05/27/2015 (Approximate) I/O last 3 completed shifts: In: 1205 [P.O.:480; I.V.:725] Out: -  Total I/O In: 250 [I.V.:250] Out: -   Fetal Wellbeing:  Category I UC:   regular, every 6-10 minutes SVE:   Dilation: 2 Effacement (%): 90 Station: +1 Exam by:: TFH  Labs: Lab Results  Component Value Date   WBC 11.3* 01/06/2016   HGB 10.5* 01/06/2016   HCT 31.6* 01/06/2016   MCV 78.7* 01/06/2016   PLT 242 01/06/2016    Assessment / Plan: HD#6 with PPROM-iregular contractions noted- with resolution after IVF bolus and tocolytics, will return to Mother-Baby unit if contractions do no resolve  Labor: aborted at this time Preeclampsia:  labs stable Pain Control:  NA Anticipated MOD:  hopeful for vaginal delivery in the future.  Shaketha Jeon NIKE Maxamus Colao, CNM 01/12/2016, 9:39 PM

## 2016-01-12 NOTE — Plan of Care (Signed)
Recvd from MB 340 with c/o contractions for about an hour.  To bed and EFM applied.

## 2016-01-12 NOTE — Progress Notes (Signed)
L&D OB Triage Note  Judy PaddockJasmine Schultz is a 30 y.o. 32P0101 female at 5690w4d, EDD Estimated Date of Delivery: 03/04/16 who presented to scheduled NST due to PPROM- stable and staing inpatient on M/B unit.Marland Kitchen.  She was evaluated by the nurses with no significant findings/findings significant for fetal distress or contractions. Vital signs stable. An NST was performed and has been reviewed by myself. She was  Returned to room on M/B   NST INTERPRETATION: Indications: PPROM  Mode: External Baseline Rate (A): 150 bpm Variability: Moderate Accelerations: 10 x 10 Decelerations: Variable Nonstress Test Interpretation: Reactive Overall Impression: Reassuring for gestational age Contraction Frequency (min): 0  Impression: reactive and consistent with all previous NSTs   Plan: NST performed was reviewed and was found to be reactive. To continue inpatient observation and antibiotics until delivery or IOL.   Fonnie Crookshanks Suzan NailerN Eula Mazzola, CNM

## 2016-01-12 NOTE — Consult Note (Signed)
Asked by Dr.Cherry to provide prenatal consultation for patient at risk for preterm delivery due to PPROM and preterm labor.  Mother is 30 y.o. G2 P0-1-0-1 who is now 32.[redacted] weeks EGA.  She had PROM 6/10 and was admitted then and  treated with betamethasone and antibiotics.  She has continued to leak fluid but has not had fever or other signs of infection.  Contractions started today (after NST, which was reactive) and she was moved to L&D and is being treated with terbutaline tocolysis.  Discussed usual expectations for preterm infant at 8932 - [redacted] weeks gestation, including possible needs for DR resuscitation, respiratory support, IV access.  Projected possible length of stay in NICU until 37 - [redacted] wks EGA.  Discussed advantages of feeding with mother's milk but stated her preference for formula feeding.  Patient was attentive, had appropriate questions, and was appreciative of my input.  Thank you for consulting Neonatology.  Total time 25 minutes  JWimmer, MD

## 2016-01-12 NOTE — Progress Notes (Signed)
  Antenatal Progress Note  Subjective:     Patient ID: Judy Schultz is a 30 y.o. female 4963w4d, Estimated Date of Delivery: 03/04/16 by Patient's last menstrual period was 05/27/2015 (approximate). who was admitted for PPROM.  HD# 6.   Subjective:  Patient reports complaints of mild cramping.  Notes she is still leaking small amount of clear fluid occasionally, but not heavy.    Review of Systems Denies contractions, vaginal bleeding, fevers, and reports good fetal movement.     Objective:   Filed Vitals:   01/12/16 0514 01/12/16 0745 01/12/16 0951 01/12/16 1144  BP: 137/70 120/62 127/68 125/70  Pulse: 87 83 95 94  Temp: 98.6 F (37 C) 98.3 F (36.8 C) 97.6 F (36.4 C) 98.3 F (36.8 C)  TempSrc: Oral Oral Oral Oral  Resp: 20 16  20   Height:      Weight:      SpO2: 100% 100%  100%    General appearance: alert and no distress Lungs: clear to auscultation bilaterally Heart: regular rate and rhythm, S1, S2 normal, no murmur, click, rub or gallop Abdomen: gravid, soft, non-tender; bowel sounds normal; no masses,  no organomegaly. FHT 152 on doppler. Pelvic: deferred Extremities: extremities normal, atraumatic, no cyanosis or edema   NST INTERPRETATION (01/12/16 morning NST): Indications: rule out uterine contractions and PPROM with oligohydramnios  Mode: External Baseline Rate (A): 150 bpm Variability: Moderate Accelerations: 10 x 10 Decelerations: Variable Nonstress Test Interpretation: Reactive Overall Impression: Reassuring for gestational age Contraction Frequency (min): 0  Impression: reactive   Labs:  Lab Results  Component Value Date   ABORH A POS 01/10/2016   Lab Results  Component Value Date   WBC 11.3* 01/06/2016   HGB 10.5* 01/06/2016   HCT 31.6* 01/06/2016   MCV 78.7* 01/06/2016   PLT 242 01/06/2016      Assessment:  [age] y.o. female 5563w4d, Estimated Date of Delivery: 03/04/16 with:    Patient Active Problem List   Diagnosis Date Noted  .  Preterm premature rupture of membranes 01/06/2016  . H/O preterm delivery, currently pregnant 01/06/2016  . Oligohydramnios 01/06/2016    Plan:   1. Continue antibiotics for latency (now transitioned to amoxicillin and azithromycin PO x 5 days, s/p 2 days of IV antibiotics).  On Day #6 of 7 of total antibiotic course. 2. Is s/p course of antenatal steroids.  3. S/p Neonatology consult 4. S/p MFM consult. Recommendations noted. 5. Continue NST daily 6. Discontinue daily DVT prophylaxis with Lovenox, can switch to SCDs 7. Continue bedrest with bathroom privileges. May be have 5-10 min outside breaks in wheelchair.  8. Regular diet 9. Will repeat AFI once antibiotics completed.  10  Continue to monitor for signs and symptoms of chorioamnionitis or preterm labor    Hildred LaserAnika Issaic Welliver, MD [facility]

## 2016-01-12 NOTE — Plan of Care (Signed)
Received to OBS 3 for daily NST.  Monitors applied and reviewed NST procedure.  Verbalized and demonstrated understanding.  Temp 97.6 F orally.  States that she is leaking amniotic fluid with changes in position.  Will continue to closely monitor.

## 2016-01-13 ENCOUNTER — Inpatient Hospital Stay: Payer: Medicaid Other | Admitting: Anesthesiology

## 2016-01-13 ENCOUNTER — Inpatient Hospital Stay: Payer: Medicaid Other

## 2016-01-13 ENCOUNTER — Encounter: Payer: Self-pay | Admitting: Anesthesiology

## 2016-01-13 DIAGNOSIS — Z3480 Encounter for supervision of other normal pregnancy, unspecified trimester: Secondary | ICD-10-CM

## 2016-01-13 LAB — CBC
HEMATOCRIT: 33 % — AB (ref 35.0–47.0)
HEMOGLOBIN: 10.9 g/dL — AB (ref 12.0–16.0)
MCH: 25.9 pg — AB (ref 26.0–34.0)
MCHC: 32.9 g/dL (ref 32.0–36.0)
MCV: 78.8 fL — AB (ref 80.0–100.0)
Platelets: 263 10*3/uL (ref 150–440)
RBC: 4.19 MIL/uL (ref 3.80–5.20)
RDW: 14 % (ref 11.5–14.5)
WBC: 22.3 10*3/uL — ABNORMAL HIGH (ref 3.6–11.0)

## 2016-01-13 LAB — URINALYSIS COMPLETE WITH MICROSCOPIC (ARMC ONLY)
Bilirubin Urine: NEGATIVE
GLUCOSE, UA: NEGATIVE mg/dL
Hgb urine dipstick: NEGATIVE
Leukocytes, UA: NEGATIVE
NITRITE: NEGATIVE
Protein, ur: NEGATIVE mg/dL
SPECIFIC GRAVITY, URINE: 1.006 (ref 1.005–1.030)
pH: 7 (ref 5.0–8.0)

## 2016-01-13 LAB — TYPE AND SCREEN
ABO/RH(D): A POS
ANTIBODY SCREEN: NEGATIVE

## 2016-01-13 LAB — RENAL FUNCTION PANEL
ANION GAP: 13 (ref 5–15)
Albumin: 3 g/dL — ABNORMAL LOW (ref 3.5–5.0)
BUN: <5 mg/dL — ABNORMAL LOW (ref 6–20)
CALCIUM: 8.9 mg/dL (ref 8.9–10.3)
CO2: 19 mmol/L — AB (ref 22–32)
Chloride: 99 mmol/L — ABNORMAL LOW (ref 101–111)
Creatinine, Ser: 0.58 mg/dL (ref 0.44–1.00)
GFR calc non Af Amer: >60 mL/min (ref >60)
Glucose, Bld: 168 mg/dL — ABNORMAL HIGH (ref 65–99)
Phosphorus: 2.8 mg/dL (ref 2.5–4.6)
Potassium: 3.2 mmol/L — ABNORMAL LOW (ref 3.5–5.1)
SODIUM: 131 mmol/L — AB (ref 135–145)

## 2016-01-13 MED ORDER — TERBUTALINE SULFATE 1 MG/ML IJ SOLN: 0.2500 mg | Freq: Once | INTRAMUSCULAR | Status: DC | PRN

## 2016-01-13 MED ORDER — NALBUPHINE HCL 10 MG/ML IJ SOLN: 5.0000 mg | INTRAMUSCULAR | Status: DC | PRN

## 2016-01-13 MED ORDER — DIPHENHYDRAMINE HCL 25 MG PO CAPS: 25.0000 mg | ORAL_CAPSULE | Freq: Four times a day (QID) | ORAL | Status: DC | PRN

## 2016-01-13 MED ORDER — SODIUM CHLORIDE 0.9 % IV SOLN
2.0000 g | Freq: Four times a day (QID) | INTRAVENOUS | Status: DC
  Administered 2016-01-13: 2 g via INTRAVENOUS
  Filled 2016-01-13: qty 2000

## 2016-01-13 MED ORDER — OXYTOCIN 10 UNIT/ML IJ SOLN
INTRAMUSCULAR | Status: AC
  Filled 2016-01-13: qty 2

## 2016-01-13 MED ORDER — NIFEDIPINE 10 MG PO CAPS: 10.0000 mg | ORAL_CAPSULE | Freq: Once | ORAL | Status: DC

## 2016-01-13 MED ORDER — SODIUM CHLORIDE 0.9% FLUSH: 3.0000 mL | INTRAVENOUS | Status: DC | PRN

## 2016-01-13 MED ORDER — DIPHENHYDRAMINE HCL 50 MG/ML IJ SOLN: 12.5000 mg | INTRAMUSCULAR | Status: DC | PRN

## 2016-01-13 MED ORDER — GENTAMICIN SULFATE 40 MG/ML IJ SOLN: 7.0000 mg/kg | INTRAVENOUS | Status: DC

## 2016-01-13 MED ORDER — ZOLPIDEM TARTRATE 5 MG PO TABS: 5.0000 mg | ORAL_TABLET | Freq: Every evening | ORAL | Status: DC | PRN

## 2016-01-13 MED ORDER — FENTANYL CITRATE (PF) 100 MCG/2ML IJ SOLN
INTRAMUSCULAR | Status: AC
  Administered 2016-01-13: 100 ug via INTRAVENOUS
  Filled 2016-01-13: qty 2

## 2016-01-13 MED ORDER — LIDOCAINE HCL (PF) 1 % IJ SOLN
INTRAMUSCULAR | Status: DC | PRN
  Administered 2016-01-13: 3 mL via SUBCUTANEOUS

## 2016-01-13 MED ORDER — ONDANSETRON HCL 4 MG/2ML IJ SOLN
4.0000 mg | Freq: Three times a day (TID) | INTRAMUSCULAR | Status: DC | PRN
  Administered 2016-01-13: 4 mg via INTRAVENOUS
  Filled 2016-01-13: qty 2

## 2016-01-13 MED ORDER — NALOXONE HCL 2 MG/2ML IJ SOSY
1.0000 ug/kg/h | PREFILLED_SYRINGE | INTRAVENOUS | Status: DC | PRN
  Filled 2016-01-13: qty 2

## 2016-01-13 MED ORDER — MEPERIDINE HCL 25 MG/ML IJ SOLN: 6.2500 mg | INTRAMUSCULAR | Status: DC | PRN

## 2016-01-13 MED ORDER — FENTANYL 2.5 MCG/ML W/ROPIVACAINE 0.2% IN NS 100 ML EPIDURAL INFUSION (ARMC-ANES)
EPIDURAL | Status: AC
  Filled 2016-01-13: qty 100

## 2016-01-13 MED ORDER — NALBUPHINE HCL 10 MG/ML IJ SOLN: 5.0000 mg | Freq: Once | INTRAMUSCULAR | Status: DC | PRN

## 2016-01-13 MED ORDER — SODIUM CHLORIDE 0.9 % IV SOLN
INTRAVENOUS | Status: DC | PRN
  Administered 2016-01-13 (×2): 5 mL via EPIDURAL

## 2016-01-13 MED ORDER — ACETAMINOPHEN 325 MG PO TABS
ORAL_TABLET | ORAL | Status: AC
  Administered 2016-01-13: 650 mg via ORAL
  Filled 2016-01-13: qty 2

## 2016-01-13 MED ORDER — TETANUS-DIPHTH-ACELL PERTUSSIS 5-2.5-18.5 LF-MCG/0.5 IM SUSP: 0.5000 mL | Freq: Once | INTRAMUSCULAR | Status: DC

## 2016-01-13 MED ORDER — BUTORPHANOL TARTRATE 1 MG/ML IJ SOLN
INTRAMUSCULAR | Status: AC
  Administered 2016-01-13: 1 mg via INTRAVENOUS
  Filled 2016-01-13: qty 1

## 2016-01-13 MED ORDER — KETOROLAC TROMETHAMINE 30 MG/ML IJ SOLN: 30.0000 mg | Freq: Four times a day (QID) | INTRAMUSCULAR | Status: DC | PRN

## 2016-01-13 MED ORDER — NIFEDIPINE 10 MG PO CAPS
10.0000 mg | ORAL_CAPSULE | Freq: Once | ORAL | Status: AC
  Administered 2016-01-13: 10 mg via ORAL
  Filled 2016-01-13: qty 1

## 2016-01-13 MED ORDER — COCONUT OIL OIL
1.0000 "application " | TOPICAL_OIL | Status: DC | PRN
  Filled 2016-01-13: qty 120

## 2016-01-13 MED ORDER — TERBUTALINE SULFATE 1 MG/ML IJ SOLN
INTRAMUSCULAR | Status: AC
  Administered 2016-01-13: 0.25 mg via SUBCUTANEOUS
  Filled 2016-01-13: qty 1

## 2016-01-13 MED ORDER — TERBUTALINE SULFATE 1 MG/ML IJ SOLN
0.2500 mg | Freq: Once | INTRAMUSCULAR | Status: AC
  Administered 2016-01-13 (×2): 0.25 mg via SUBCUTANEOUS

## 2016-01-13 MED ORDER — PRENATAL MULTIVITAMIN CH
1.0000 | ORAL_TABLET | Freq: Every day | ORAL | Status: DC
  Filled 2016-01-13 (×2): qty 1

## 2016-01-13 MED ORDER — FENTANYL CITRATE (PF) 100 MCG/2ML IJ SOLN
100.0000 ug | Freq: Once | INTRAMUSCULAR | Status: AC
  Administered 2016-01-13: 100 ug via INTRAVENOUS

## 2016-01-13 MED ORDER — LIDOCAINE-EPINEPHRINE (PF) 1.5 %-1:200000 IJ SOLN
INTRAMUSCULAR | Status: DC | PRN
  Administered 2016-01-13: 3 mL via EPIDURAL

## 2016-01-13 MED ORDER — NALOXONE HCL 0.4 MG/ML IJ SOLN: 0.4000 mg | INTRAMUSCULAR | Status: DC | PRN

## 2016-01-13 MED ORDER — ACETAMINOPHEN 325 MG PO TABS
650.0000 mg | ORAL_TABLET | ORAL | Status: DC | PRN
  Administered 2016-01-13 – 2016-01-14 (×2): 650 mg via ORAL
  Filled 2016-01-13 (×2): qty 2

## 2016-01-13 MED ORDER — TERBUTALINE SULFATE 1 MG/ML IJ SOLN
0.2500 mg | Freq: Once | INTRAMUSCULAR | Status: AC
  Administered 2016-01-13: 0.25 mg via SUBCUTANEOUS

## 2016-01-13 MED ORDER — MISOPROSTOL 200 MCG PO TABS
ORAL_TABLET | ORAL | Status: AC
  Administered 2016-01-13: 800 ug
  Filled 2016-01-13: qty 4

## 2016-01-13 MED ORDER — LIDOCAINE HCL (PF) 1 % IJ SOLN
INTRAMUSCULAR | Status: AC
  Filled 2016-01-13: qty 30

## 2016-01-13 MED ORDER — ONDANSETRON HCL 4 MG/2ML IJ SOLN: 4.0000 mg | INTRAMUSCULAR | Status: DC | PRN

## 2016-01-13 MED ORDER — DIBUCAINE 1 % RE OINT: 1.0000 "application " | TOPICAL_OINTMENT | RECTAL | Status: DC | PRN

## 2016-01-13 MED ORDER — GENTAMICIN SULFATE 40 MG/ML IJ SOLN
5.0000 mg/kg | INTRAVENOUS | Status: DC
  Administered 2016-01-13: 430 mg via INTRAVENOUS
  Filled 2016-01-13: qty 10.75

## 2016-01-13 MED ORDER — WITCH HAZEL-GLYCERIN EX PADS: 1.0000 "application " | MEDICATED_PAD | CUTANEOUS | Status: DC | PRN

## 2016-01-13 MED ORDER — ONDANSETRON HCL 4 MG PO TABS: 4.0000 mg | ORAL_TABLET | ORAL | Status: DC | PRN

## 2016-01-13 MED ORDER — MISOPROSTOL 200 MCG PO TABS: 600.0000 ug | ORAL_TABLET | Freq: Once | ORAL | Status: DC

## 2016-01-13 MED ORDER — IBUPROFEN 600 MG PO TABS
600.0000 mg | ORAL_TABLET | Freq: Four times a day (QID) | ORAL | Status: DC
  Administered 2016-01-13 – 2016-01-14 (×5): 600 mg via ORAL
  Filled 2016-01-13 (×5): qty 1

## 2016-01-13 MED ORDER — DIPHENHYDRAMINE HCL 25 MG PO CAPS: 25.0000 mg | ORAL_CAPSULE | ORAL | Status: DC | PRN

## 2016-01-13 MED ORDER — BENZOCAINE-MENTHOL 20-0.5 % EX AERO: 1.0000 "application " | INHALATION_SPRAY | CUTANEOUS | Status: DC | PRN

## 2016-01-13 MED ORDER — AMMONIA AROMATIC IN INHA
RESPIRATORY_TRACT | Status: AC
  Filled 2016-01-13: qty 10

## 2016-01-13 MED ORDER — OXYTOCIN 40 UNITS IN LACTATED RINGERS INFUSION - SIMPLE MED
1.0000 m[IU]/min | INTRAVENOUS | Status: DC
  Administered 2016-01-13: 1 m[IU]/min via INTRAVENOUS
  Filled 2016-01-13: qty 1000

## 2016-01-13 MED ORDER — SENNOSIDES-DOCUSATE SODIUM 8.6-50 MG PO TABS
2.0000 | ORAL_TABLET | ORAL | Status: DC
  Administered 2016-01-13: 2 via ORAL
  Filled 2016-01-13: qty 2

## 2016-01-13 MED ORDER — FENTANYL 2.5 MCG/ML W/ROPIVACAINE 0.2% IN NS 100 ML EPIDURAL INFUSION (ARMC-ANES)
10.0000 mL/h | EPIDURAL | Status: DC
  Administered 2016-01-13: 10 mL/h via EPIDURAL

## 2016-01-13 MED ORDER — BUTORPHANOL TARTRATE 1 MG/ML IJ SOLN
1.0000 mg | INTRAMUSCULAR | Status: DC | PRN
  Administered 2016-01-13: 2 mg via INTRAVENOUS
  Administered 2016-01-13: 1 mg via INTRAVENOUS
  Filled 2016-01-13: qty 2

## 2016-01-13 MED ORDER — SIMETHICONE 80 MG PO CHEW: 80.0000 mg | CHEWABLE_TABLET | ORAL | Status: DC | PRN

## 2016-01-13 NOTE — Progress Notes (Signed)
Judy Schultz is a 30 y.o. G2P0101 at 7360w5d by ultrasound admitted for Preterm labor, PROM; s/p Steroids for lung maturation; s/p latency antibiotics. Now in labor. Contractions painful.  Subjective:  Objective: BP 134/73 mmHg  Pulse 116  Temp(Src) 98.5 F (36.9 C) (Oral)  Resp 16  Ht 5\' 8"  (1.727 m)  Wt 264 lb 6 oz (119.92 kg)  BMI 40.21 kg/m2  SpO2 100%  LMP 05/27/2015 (Approximate) I/O last 3 completed shifts: In: 975 [I.V.:975] Out: -    U/S AFI this am 4.3 cm EFW 4pounds 8 ounces Uterus mildly tender Cervix 3-4 cm FHT:  150-160; category 1 UC:   irregular SVE:   Dilation: 2 Effacement (%): 90 Station: -1 Exam by:: JG, RN  Labs: Lab Results  Component Value Date   WBC 22.3* 01/13/2016   HGB 10.9* 01/13/2016   HCT 33.0* 01/13/2016   MCV 78.8* 01/13/2016   PLT 263 01/13/2016    Assessment / Plan: PPROM at 32.5 weeks now in labor; Chorioamnionitis; s/p steroids and latency antibioics   IV Amp/Gent Pitocin augmentation as needed Epidural Anticipate SVD  Herold HarmsMartin A Garo Heidelberg, MD  I6/17/2017, 10:42 AM

## 2016-01-13 NOTE — Progress Notes (Signed)
S:Upon review of chart blood pressures mildly elevated both prior to and after delivery.  Pt denies  headache, vision changes, or epigastric pain.    O:    152/83    by Elaina HoopsLetitia S Elks, RN at 01/13/16 1430    149/82    by Elaina HoopsLetitia S Elks, RN at 01/13/16 1335    151/80    by Elaina HoopsLetitia S Elks, RN at 01/13/16 1235    147/84    by Elaina HoopsLetitia S Elks, RN at 01/13/16 1220    152/73    by Elaina HoopsLetitia S Elks, RN at 01/13/16 1205    153/72    by Elaina HoopsLetitia S Elks, RN at 01/13/16 1200    155/81    by Elaina HoopsLetitia S Elks, RN at 01/13/16 1159    145/79    by Elaina HoopsLetitia S Elks, RN at 01/13/16 1155    146/85    by Elaina HoopsLetitia S Elks, RN at 01/13/16 1150    144/76    A: NSVD @ 32 wks IUP Suspected Chorioamnionitis Elevated blood pressure  P: Reviewed blood pressures with Dr. Greggory KeeneFrancesco > obtain cath UA, if >1+ protein obtain CBC and CMP Marlis EdelsonWalidah N Karim, CNM

## 2016-01-13 NOTE — Anesthesia Procedure Notes (Signed)
Epidural Patient location during procedure: OB Start time: 01/13/2016 11:39 AM End time: 01/13/2016 11:50 AM  Staffing Anesthesiologist: Lenard SimmerKARENZ, Maddisen Vought Performed by: anesthesiologist   Preanesthetic Checklist Completed: patient identified, site marked, surgical consent, pre-op evaluation, timeout performed, IV checked, risks and benefits discussed and monitors and equipment checked  Epidural Patient position: sitting Prep: ChloraPrep Patient monitoring: heart rate, continuous pulse ox and blood pressure Approach: midline Location: L4-L5 Injection technique: LOR saline  Needle:  Needle type: Tuohy  Needle gauge: 17 G Needle length: 9 cm and 9 Needle insertion depth: 7 cm Catheter type: closed end flexible Catheter size: 19 Gauge Catheter at skin depth: 11 cm Test dose: negative and 1.5% lidocaine with Epi 1:200 K  Assessment Events: blood not aspirated, injection not painful, no injection resistance, negative IV test and no paresthesia  Additional Notes   Patient tolerated the insertion well without complications.Reason for block:procedure for pain

## 2016-01-13 NOTE — Progress Notes (Addendum)
Judy Schultz is a 30 y.o. G2P0101 at 6230w5d by ultrasound admitted for rupture of membranes  Subjective: Pt reports needing additional meds for pain management.  Pain concentrated on mid right abdomen.    Objective: BP 134/73 mmHg  Pulse 116  Temp(Src) 98.5 F (36.9 C) (Oral)  Resp 16  Ht 5\' 8"  (1.727 m)  Wt 119.92 kg (264 lb 6 oz)  BMI 40.21 kg/m2  SpO2 100%  LMP 05/27/2015 (Approximate) I/O last 3 completed shifts: In: 975 [I.V.:975] Out: -     FHT:  FHR: 150's bpm, variability: minimal ,  accelerations:  Abscent,  decelerations:  Absent UC:  irritability SVE:   Dilation: 3.5 Effacement (%): 90 Station: -1 Exam by:: Elenora FenderKarim, W. CNM  Labs: Lab Results  Component Value Date   WBC 22.3* 01/13/2016   HGB 10.9* 01/13/2016   HCT 33.0* 01/13/2016   MCV 78.8* 01/13/2016   PLT 263 01/13/2016   Bedside ultrasound completed by sonographer:  Vertex presentation, additional results pending  Assessment / Plan: Early labor  PPROM - Abdominal Pain Elevated WBC  Labor: Begin low-dose pitocin augmentation Preeclampsia:  n/a Fetal Wellbeing:  Category II Pain Control:  Epidural ordered I/D:  GBS neg; Amp & Gen for abdominal pain and PPROM x 17 days Anticipated MOD:  NSVD  KARIM, Brandy Zuba N 01/13/2016, 11:00 AM

## 2016-01-13 NOTE — Progress Notes (Signed)
Judy Schultz is a 30 y.o. G2P0101 at 7867w5d by LMP admitted for rupture of membranes, HD#7, returned to L&D last night with resumsation of uterine contractions and low grade fever. Infant was tachycardic at times, which resolved with IVF bolus and tylenol. Uterine irritability would resolve for a time after terbutaline dose. Patient was able to rest after fentanyl IV.  Subjective: Sleeping at this time.  Objective: BP 144/76 mmHg  Pulse 112  Temp(Src) 98.1 F (36.7 C) (Oral)  Resp 18  Ht 5\' 8"  (1.727 m)  Wt 264 lb 6 oz (119.92 kg)  BMI 40.21 kg/m2  SpO2 100%  LMP 05/27/2015 (Approximate) I/O last 3 completed shifts: In: 975 [I.V.:975] Out: -     Fetal Wellbeing:  Category I UC:   none SVE:   Dilation: 2 Effacement (%): 90 Station: -1 Exam by:: JG, RN  Labs: Lab Results  Component Value Date   WBC 22.3* 01/13/2016   HGB 10.9* 01/13/2016   HCT 33.0* 01/13/2016   MCV 78.8* 01/13/2016   PLT 263 01/13/2016    Assessment / Plan: PPROM with uterine irritability and elevated WBC after 7days ROM  Labor: not in active labor Preeclampsia:  no signs of PIH Pain Control:  IV pain meds Anticipated MOD:  NSVD Discussed labs and patient status this am with Dr Greggory Keenefrancesco, and POC is as follows:  will restart IV antibiotics and proceed with deliver in leu of suspected chorioamnionitis Judy Schultz, CNM will be covering till tomorrow.Judy Schultz.  Judy Schultz, CNM 01/13/2016, 8:23 AM

## 2016-01-13 NOTE — Anesthesia Preprocedure Evaluation (Signed)
Anesthesia Evaluation  Patient identified by MRN, date of birth, ID band Patient awake    Reviewed: Allergy & Precautions, H&P , NPO status , Patient's Chart, lab work & pertinent test results, reviewed documented beta blocker date and time   History of Anesthesia Complications Negative for: history of anesthetic complications  Airway Mallampati: II  TM Distance: >3 FB Neck ROM: full    Dental no notable dental hx. (+) Teeth Intact   Pulmonary neg shortness of breath, asthma , neg sleep apnea, neg COPD, neg recent URI, former smoker,    Pulmonary exam normal breath sounds clear to auscultation       Cardiovascular Exercise Tolerance: Good negative cardio ROS Normal cardiovascular exam Rhythm:regular Rate:Normal     Neuro/Psych negative neurological ROS  negative psych ROS   GI/Hepatic negative GI ROS, Neg liver ROS,   Endo/Other  neg diabetesMorbid obesity  Renal/GU negative Renal ROS  negative genitourinary   Musculoskeletal   Abdominal   Peds  Hematology negative hematology ROS (+)   Anesthesia Other Findings Past Medical History:   Personal history of asthma                                   Reproductive/Obstetrics (+) Pregnancy                             Anesthesia Physical Anesthesia Plan  ASA: II  Anesthesia Plan: Epidural   Post-op Pain Management:    Induction:   Airway Management Planned:   Additional Equipment:   Intra-op Plan:   Post-operative Plan:   Informed Consent: I have reviewed the patients History and Physical, chart, labs and discussed the procedure including the risks, benefits and alternatives for the proposed anesthesia with the patient or authorized representative who has indicated his/her understanding and acceptance.   Dental Advisory Given  Plan Discussed with: Anesthesiologist, CRNA and Surgeon  Anesthesia Plan Comments:          Anesthesia Quick Evaluation

## 2016-01-14 LAB — CBC
HCT: 31.4 % — ABNORMAL LOW (ref 35.0–47.0)
Hemoglobin: 10.2 g/dL — ABNORMAL LOW (ref 12.0–16.0)
MCH: 25.8 pg — ABNORMAL LOW (ref 26.0–34.0)
MCHC: 32.5 g/dL (ref 32.0–36.0)
MCV: 79.4 fL — AB (ref 80.0–100.0)
PLATELETS: 250 10*3/uL (ref 150–440)
RBC: 3.96 MIL/uL (ref 3.80–5.20)
RDW: 14 % (ref 11.5–14.5)
WBC: 20 10*3/uL — ABNORMAL HIGH (ref 3.6–11.0)

## 2016-01-14 NOTE — Progress Notes (Signed)
Pt states that she received TDaP vaccine during pregnancy.  Pt declines TDaP vaccine at this time. Reynold BowenSusan Paisley Mckinley Adelstein, RN 01/14/2016 12:20 PM

## 2016-01-14 NOTE — Progress Notes (Signed)
Discharge instructions provided.  Pt and sig other verbalize understanding of all instructions and follow-up care.  Pt discharged to home via wheelchair by RN at 1755 on 01/14/16. Reynold BowenSusan Paisley Merrell Rettinger, RN 01/14/2016 6:16 PM

## 2016-01-14 NOTE — Anesthesia Post-op Follow-up Note (Signed)
  Anesthesia Pain Follow-up Note  Patient: Carmelina PaddockJasmine Reason  Day #: 1  Date of Follow-up: 01/14/2016 Time: 7:13 PM  Last Vitals: There were no vitals filed for this visit.  Level of Consciousness: alert  Pain: mild   Side Effects:None  Catheter Site Exam:clean, dry, no drainage  Plan: D/C from anesthesia care  Lenard SimmerAndrew Lashanta Elbe

## 2016-01-14 NOTE — Clinical Social Work Maternal (Signed)
  CLINICAL SOCIAL WORK MATERNAL/CHILD NOTE  Patient Details  Name: Judy Schultz MRN: [id number] Date of Birth: Nov 16, 1985  Date:  01/14/2016  Clinical Social Worker Initiating Note:   Blima Rich, [place] [phone]) Date/ Time Initiated:  01/14/16/1112     Child's Name:      Legal Guardian:  Mother   Need for Interpreter:  None   Date of Referral:  01/14/16     Reason for Referral:  Parental Support of Premature Babies < 53 weeks/or Critically Ill babies    Referral Source:  RN   Address:   ([address] 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 )   Type of Work:     Education:  Chiropractor Resources:  Medicaid   Other Resources:  [employer]   Cultural/Religious Considerations Which May Impact Care:  N/A  Strengths:  Ability to meet basic needs , Compliance with medical plan , Home prepared for child    Risk Factors/Current Problems:  None   Cognitive State:  Alert , Able to Concentrate    Mood/Affect:  Happy , Calm    CSW Assessment: Clinical Education officer, museum (CSW) received consult for parental support for NICU baby. Per RN baby is doing good and no concerns have been noted. CSW met with mother and father of the baby at bedside. CSW introduced self and explained role of CSW department. Per mother she is a stay at home mom and her husband Judy Schultz works full time. Per husband his monthly income is around $2,000. Per mom this is their second baby and they have a 6 year old daughter. Mother and father are optimistic about baby's health and the NICU stay. Per dad he has a car and they have no transportation concerns. Mom reported that they will be moving to [place] with her husband's family in order to have more  support. Per father this family has the home ready and prepared for a newborn and they have all supplies needed. Per mom she has Medicaid and WIC. Mother inquired about income based housing in [place]. CSW explained that an application with the [employer] or [employer] will have to be submitted. Mother and father are well informed of the resources available and how to obtain them. Mother and father reported no other needs or concerns at this time. CSW will continue to follow and assist as needed.   CSW Plan/Description:  No Further Intervention Required/No Barriers to Discharge    Elwyn Reach 01/14/2016, 11:16 AM

## 2016-01-14 NOTE — Discharge Instructions (Signed)
Call your doctor for increased pain or vaginal bleeding, temperature above 100.4, depression, or concerns.  No strenuous activity or heavy lifting for 6 weeks.  No intercourse, tampons, douching, or enemas for 6 weeks.  No tub baths-showers only.  No driving for 2 weeks or while taking pain medications.  Continue prenatal vitamin and iron.   °

## 2016-01-14 NOTE — Anesthesia Postprocedure Evaluation (Signed)
Anesthesia Post Note  Patient: Carmelina PaddockJasmine Thibeaux  Procedure(s) Performed: * No procedures listed *  Patient location during evaluation: Mother Baby Anesthesia Type: Epidural Level of consciousness: awake and alert Pain management: pain level controlled Vital Signs Assessment: post-procedure vital signs reviewed and stable Respiratory status: spontaneous breathing, nonlabored ventilation, respiratory function stable and patient connected to nasal cannula oxygen Cardiovascular status: blood pressure returned to baseline and stable Postop Assessment: no signs of nausea or vomiting Anesthetic complications: no    Last Vitals: There were no vitals filed for this visit.  Last Pain: There were no vitals filed for this visit.               Lenard SimmerAndrew Rodina Pinales

## 2016-01-14 NOTE — Progress Notes (Signed)
Prenatal labs indicate that pt needs Varicella and Rubella vaccines per CNM. Education provided to pt on rationale for vaccines.  Pt declines all vaccines at this time.  Reynold BowenSusan Paisley Zaeem Kandel, RN 01/14/2016 4:26 PM

## 2016-01-14 NOTE — Discharge Summary (Signed)
Obstetric Discharge Summary Reason for Admission: rupture of membranes and PPTD at 1773w3d Prenatal Procedures: NST and ultrasound Intrapartum Procedures: spontaneous vaginal delivery and GBS prophylaxis Postpartum Procedures: Rubella Ig and varicella vaccine Complications-Operative and Postpartum: none HEMOGLOBIN  Date Value Ref Range Status  01/14/2016 10.2* 12.0 - 16.0 g/dL Final   HCT  Date Value Ref Range Status  01/14/2016 31.4* 35.0 - 47.0 % Final   HEMATOCRIT  Date Value Ref Range Status  01/02/2016 34.1 34.0 - 46.6 % Final    Physical Exam:  General: alert, cooperative and appears stated age Lochia: appropriate Uterine Fundus: firm Incision: NA DVT Evaluation: No evidence of DVT seen on physical exam. Negative Homan's sign.  Discharge Diagnoses: PPROM with delivery at 32 weeks, suspected chorioamnionitis late onset  Discharge Information: Date: 01/14/2016 Activity: pelvic rest Diet: routine Medications: PNV, Ibuprofen and Colace, Plans Mirena PP Condition: stable Instructions: refer to practice specific booklet Discharge to: home   Newborn Data: Live born female  Birth Weight: 3 lb 5.6 oz (1520 g) APGAR: 7, 9 To remain in SCN due to prematurity  Melody N Shambley 01/14/2016, 4:05 PM

## 2016-01-16 LAB — SURGICAL PATHOLOGY

## 2016-01-17 ENCOUNTER — Encounter: Payer: Medicaid Other | Admitting: Obstetrics and Gynecology

## 2016-01-18 ENCOUNTER — Encounter: Payer: Medicaid Other | Admitting: Obstetrics and Gynecology

## 2016-01-31 ENCOUNTER — Encounter: Payer: Medicaid Other | Admitting: Obstetrics and Gynecology

## 2016-02-19 ENCOUNTER — Encounter: Payer: Self-pay | Admitting: Obstetrics and Gynecology

## 2016-02-20 ENCOUNTER — Ambulatory Visit: Payer: Medicaid Other | Admitting: Obstetrics and Gynecology

## 2016-02-28 ENCOUNTER — Ambulatory Visit: Payer: Medicaid Other | Admitting: Obstetrics and Gynecology

## 2016-03-18 ENCOUNTER — Ambulatory Visit: Payer: Medicaid Other | Admitting: Obstetrics and Gynecology

## 2016-08-19 IMAGING — US US OB FOLLOW-UP
1 series · 13 of 24 positions shown · non-contrast
Comparison: none

CLINICAL DATA: 21-year-old pregnant female with a reported history
of . turn premature rupture of membranes.

EXAM:
OBSTETRIC 14+ WK ULTRASOUND FOLLOW-UP

[Series 1: us ob follow-up · 0.23mm/px · 13 of 24 slices shown]
[im 1/24]
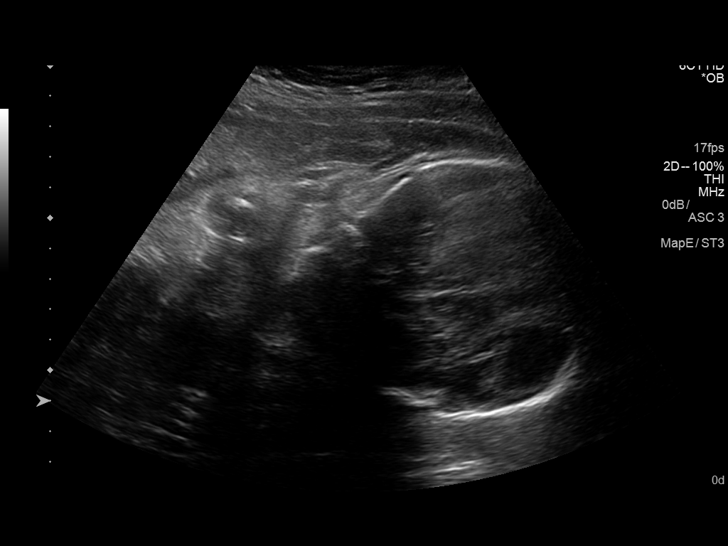
[im 3/24]
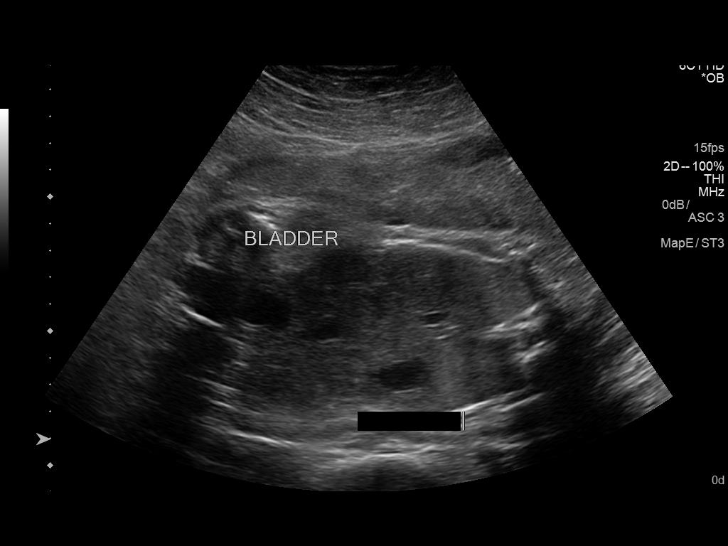
[im 5/24]
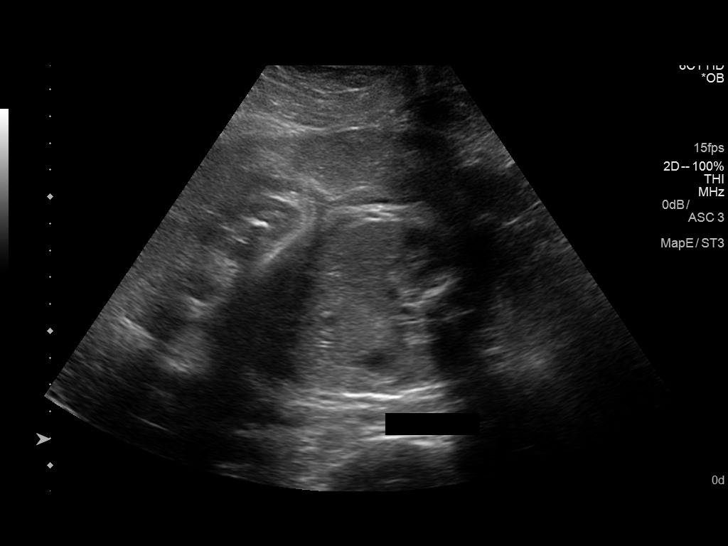
[im 7/24]
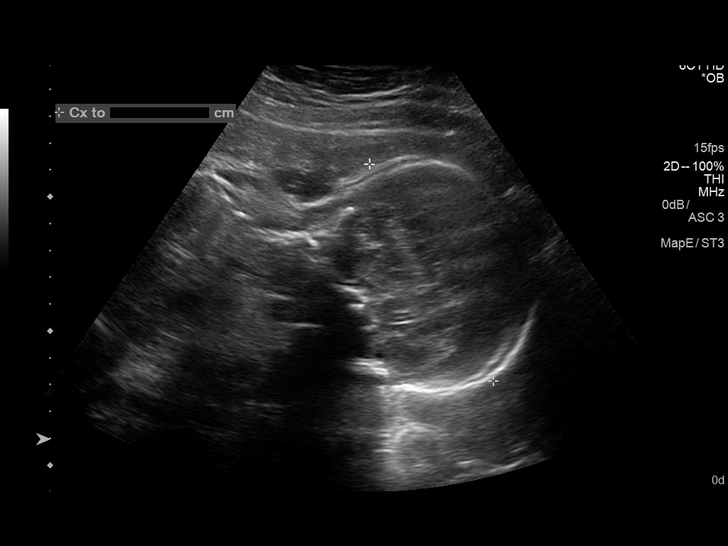
[im 9/24]
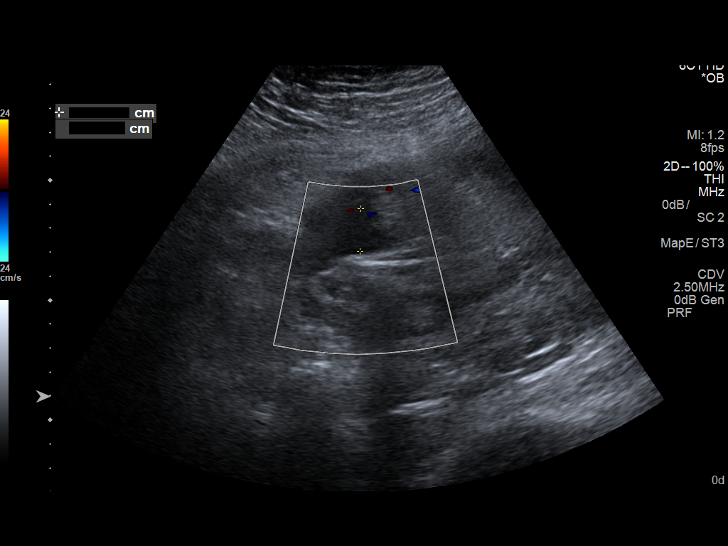
[im 11/24]
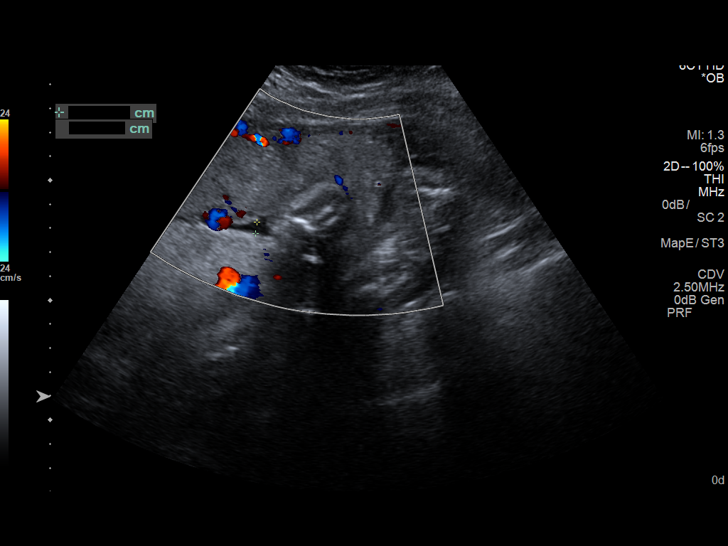
[im 13/24]
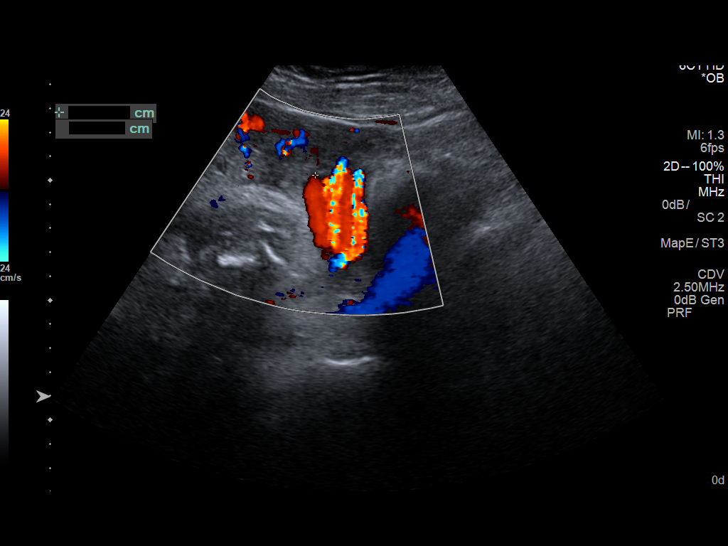
[im 14/24]
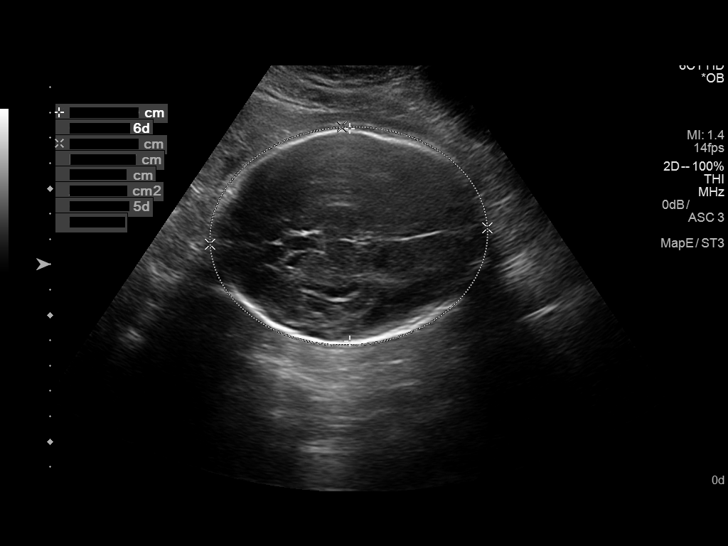
[im 16/24]
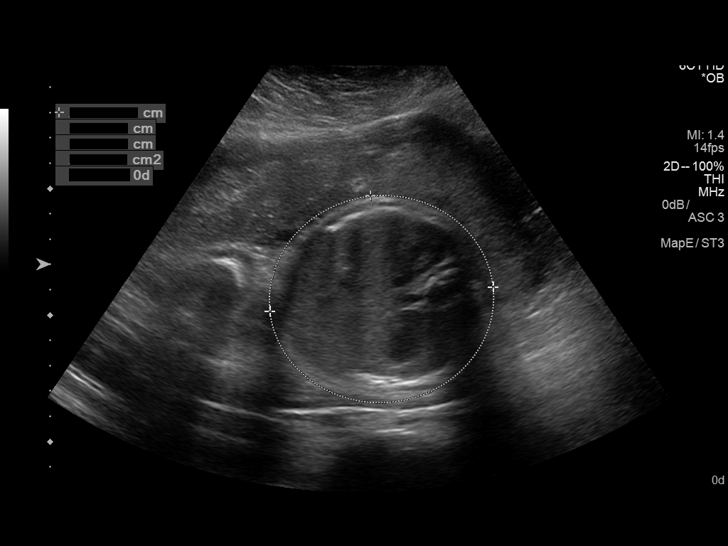
[im 18/24]
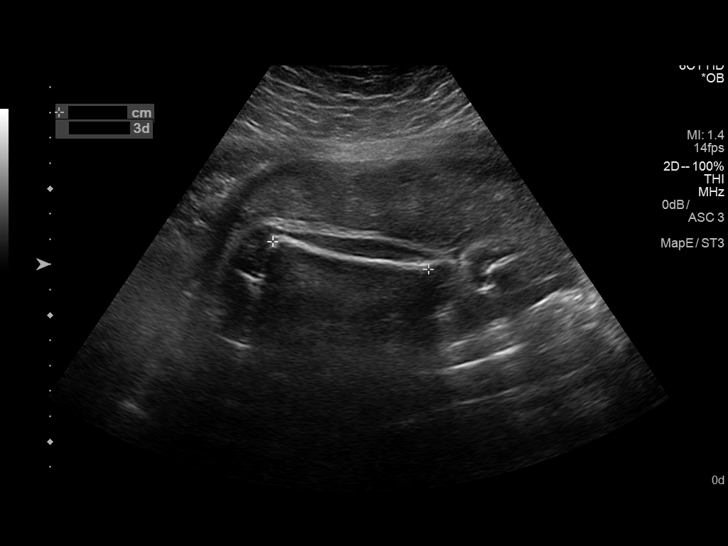
[im 20/24]
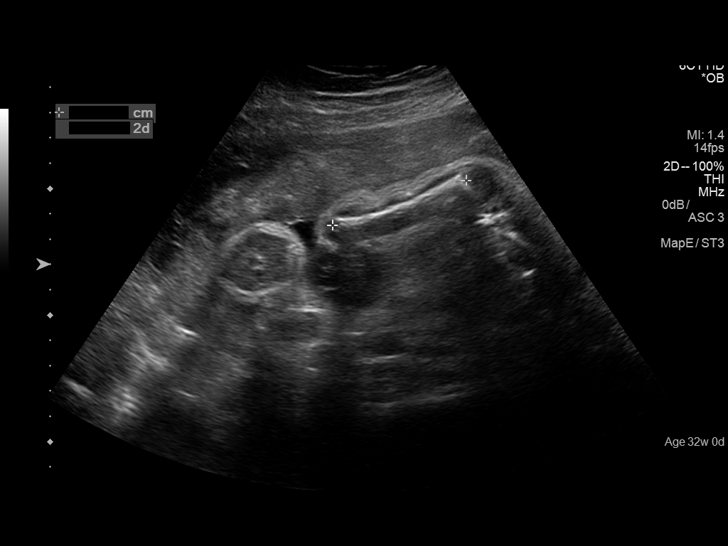
[im 22/24]
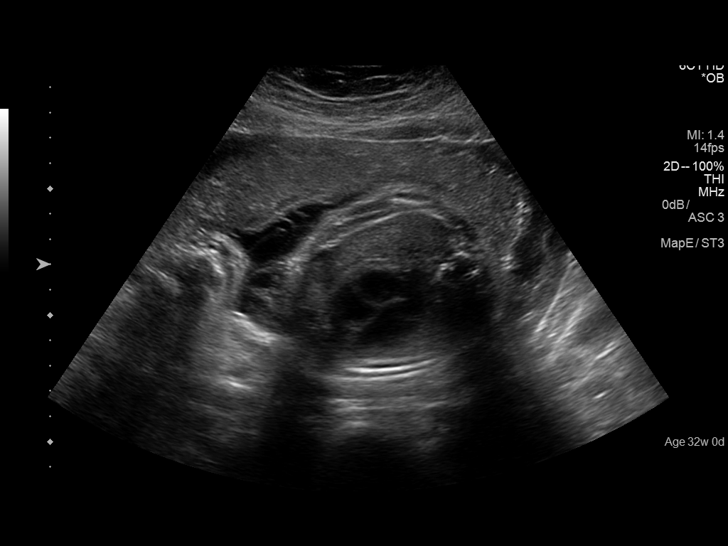
[im 24/24]
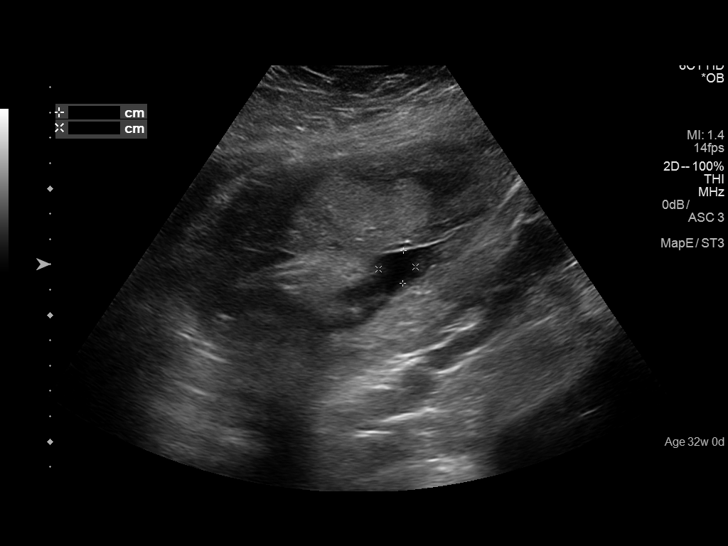

[13 of 24 positions shown; findings below may reference images not displayed]

FINDINGS: Number of Fetuses: 1

Heart Rate:  144 bpm

Movement: Yes

Presentation: Cephalic

Previa: No

Placental Location: Anterior

Amniotic Fluid (Subjective): Oligohydramnios.

Amniotic Fluid (Objective):

Vertical pocket 2.0cm

AFI 4.3 cm (5%ile= 8.6 cm, 95%= 24.2 cm for 32 wks)

FETAL BIOMETRY

BPD:  8.4cm 33w   6d

HC:    30.7cm  34w   2d

AC:   27.5cm  31w   4d

FL:   6.3cm  32w   5d

Current Mean GA: 33w 0d          US EDC: 02/24/2016

Assigned GA:  32w 0d              Assigned EDC:  03/02/2016

Estimated Fetal Weight:  1,960g 51%ile

FETAL ANATOMY

4 Chamber Heart on Left: Not well visualized

Stomach on Left: Appears normal

Kidneys: Not well visualized

Bladder: Appears normal

MATERNAL FINDINGS:

Cervix: Poorly visualized on this transabdominal study.
IMPRESSION: 1. Single living intrauterine gestation in cephalic lie at 33 weeks
0 days by average ultrasound age, compared to the expected
gestational age of 32 weeks 0 days by assigned EDC. Appropriate
interval fetal growth.
2. Estimated fetal weight 6508 g, at the 51st percentile for
expected gestational age.
3. Oligohydramnios.  AFI 4.3.
4. Cervix poorly visualized on this transabdominal study.
This exam is performed on an urgent basis and does not
comprehensively evaluate fetal anatomy; follow-up complete OB US
should be considered if further fetal assessment is warranted.

## 2019-12-17 ENCOUNTER — Ambulatory Visit
Admission: EM | Admit: 2019-12-17 | Discharge: 2019-12-17 | Disposition: A | Discharge status: Home / Self Care | Payer: Medicaid Other | Attending: Family Medicine | Admitting: Family Medicine

## 2019-12-17 ENCOUNTER — Encounter: Payer: Self-pay | Admitting: Emergency Medicine

## 2019-12-17 ENCOUNTER — Other Ambulatory Visit: Payer: Self-pay

## 2019-12-17 DIAGNOSIS — M7711 Lateral epicondylitis, right elbow: Secondary | ICD-10-CM | POA: Diagnosis not present

## 2019-12-17 DIAGNOSIS — M778 Other enthesopathies, not elsewhere classified: Secondary | ICD-10-CM | POA: Diagnosis not present

## 2019-12-17 MED ORDER — MELOXICAM 15 MG PO TABS: 15.0000 mg | ORAL_TABLET | Freq: Every day | ORAL | 0 refills | Status: DC

## 2019-12-17 MED ORDER — HYDROCODONE-ACETAMINOPHEN 5-325 MG PO TABS: ORAL_TABLET | ORAL | 0 refills | Status: DC

## 2019-12-17 NOTE — ED Triage Notes (Signed)
Patient c/o right hand pain and swelling that started couple of days ago.  Patient states that she started a new job at [employer] and noticed that she has been able to do some of her job duties because of the pain I her right hand.

## 2019-12-17 NOTE — Discharge Instructions (Signed)
Rest, ice, elevation, tylenol as needed °

## 2019-12-20 ENCOUNTER — Ambulatory Visit (INDEPENDENT_AMBULATORY_CARE_PROVIDER_SITE_OTHER): Payer: Medicaid Other

## 2019-12-20 ENCOUNTER — Other Ambulatory Visit: Payer: Self-pay

## 2019-12-20 ENCOUNTER — Ambulatory Visit
Admission: EM | Admit: 2019-12-20 | Discharge: 2019-12-20 | Disposition: A | Discharge status: Home / Self Care | Payer: Medicaid Other | Attending: Family Medicine | Admitting: Family Medicine

## 2019-12-20 ENCOUNTER — Encounter: Payer: Self-pay | Admitting: Emergency Medicine

## 2019-12-20 DIAGNOSIS — M778 Other enthesopathies, not elsewhere classified: Secondary | ICD-10-CM

## 2019-12-20 MED ORDER — HYDROCODONE-ACETAMINOPHEN 5-325 MG PO TABS: ORAL_TABLET | ORAL | 0 refills | Status: DC

## 2019-12-20 NOTE — ED Triage Notes (Signed)
Patient c/o hand pain and swelling that started about 1 week ago. She was seen here on 05/21 and was given Meloxicam and Hydrocodone. She states her hand has gotten somewhat better but the swelling continues. She needs another note for work.

## 2019-12-20 NOTE — ED Provider Notes (Signed)
[facility]    CSN: [id number] Arrival date & time: 12/17/19  1459      History   Chief Complaint Chief Complaint  Patient presents with  . Hand Pain    right    HPI Judy Schultz is a 34 y.o. female.   34 yo female with a c/o right hand pain and swelling for the past few days. Denies any falls or other type of traumatic injury. States pain is worse with gripping or elbow movement. States she recently started a new job at [employer] and does repetitive hand gripping work with her right hand involving using a drill to put some pieces together. Patient denies any fevers, rash.    Hand Pain    Past Medical History:  Diagnosis Date  . Personal history of asthma     Patient Active Problem List   Diagnosis Date Noted  . Preterm premature rupture of membranes 01/06/2016  . H/O preterm delivery, currently pregnant 01/06/2016  . Oligohydramnios 01/06/2016    Past Surgical History:  Procedure Laterality Date  . ORIF ULNAR FRACTURE Left 06/23/2013   Procedure: OPEN REDUCTION INTERNAL FIXATION (ORIF) ULNAR/Radius FRACTURE;  Surgeon: Alta Corning, MD;  Location: [facility];  Service: Orthopedics;  Laterality: Left;    OB History    Gravida  2   Para  2   Term      Preterm  2   AB      Living  2     SAB      TAB      Ectopic      Multiple  0   Live Births  2            Home Medications    Prior to Admission medications   Medication Sig Start Date End Date Taking? Authorizing Provider  HYDROcodone-acetaminophen (NORCO/VICODIN) 5-325 MG tablet 1-2 tabs po qd prn 12/17/19   Norval Gable, MD  meloxicam (MOBIC) 15 MG tablet Take 1 tablet (15 mg total) by mouth daily. 12/17/19   Norval Gable, MD    Family History History reviewed. No pertinent family history.  Social History Social History   Tobacco Use  . Smoking status: Former Research scientist (life sciences)  . Smokeless tobacco: Never Used  Substance Use Topics  . Alcohol use: No    Alcohol/week: 0.0 standard  drinks  . Drug use: No     Allergies   Patient has no known allergies.   Review of Systems Review of Systems   Physical Exam Triage Vital Signs ED Triage Vitals  Enc Vitals Group     BP 12/17/19 1538 125/76     Pulse Rate 12/17/19 1538 81     Resp 12/17/19 1538 14     Temp 12/17/19 1538 98.1 F (36.7 C)     Temp Source 12/17/19 1538 Oral     SpO2 12/17/19 1538 100 %     Weight 12/17/19 1535 225 lb (102.1 kg)     Height 12/17/19 1535 5\' 9"  (1.753 m)     Head Circumference --      Peak Flow --      Pain Score 12/17/19 1534 7     Pain Loc --      Pain Edu? --      Excl. in [place]? --    No data found.  Updated Vital Signs BP 125/76 (BP Location: Left Arm)   Pulse 81   Temp 98.1 F (36.7 C) (Oral)   Resp 14  Ht 5\' 9"  (1.753 m)   Wt 102.1 kg   LMP 11/26/2019 (Exact Date)   SpO2 100%   Breastfeeding No   BMI 33.23 kg/m   Visual Acuity Right Eye Distance:   Left Eye Distance:   Bilateral Distance:    Right Eye Near:   Left Eye Near:    Bilateral Near:     Physical Exam Vitals and nursing note reviewed.  Constitutional:      General: She is not in acute distress.    Appearance: She is not toxic-appearing or diaphoretic.  Musculoskeletal:     Right elbow: No swelling, deformity, effusion or lacerations. Normal range of motion. Tenderness present in lateral epicondyle. No radial head, medial epicondyle or olecranon process tenderness.     Right wrist: Tenderness (over the dorsum of the wrist) present. No swelling, deformity, effusion, lacerations, snuff box tenderness or crepitus. Normal range of motion. Normal pulse.     Right hand: Swelling (mild over dorsum of hand) and tenderness present. No deformity, lacerations or bony tenderness. Decreased range of motion. Normal strength. Normal sensation. There is no disruption of two-point discrimination. Normal capillary refill. Normal pulse.     Comments: Right upper extremity neurovascularly intact  Neurological:       Mental Status: She is alert.      UC Treatments / Results  Labs (all labs ordered are listed, but only abnormal results are displayed) Labs Reviewed - No data to display  EKG   Radiology DG Hand Complete Right  Result Date: 12/20/2019 CLINICAL DATA:  Right hand pain and swelling. EXAM: RIGHT HAND - COMPLETE 3+ VIEW COMPARISON:  None. FINDINGS: There is no evidence of fracture or dislocation. There is no evidence of arthropathy or other focal bone abnormality. No evidence of avascular necrosis or bone infarct. Mild soft tissue edema noted dorsally. IMPRESSION: Soft tissue edema. No osseous abnormality. Electronically Signed   By: 12/22/2019 M.D.   On: 12/20/2019 19:22    Procedures Procedures (including critical care time)  Medications Ordered in UC Medications - No data to display  Initial Impression / Assessment and Plan / UC Course  I have reviewed the triage vital signs and the nursing notes.  Pertinent labs & imaging results that were available during my care of the patient were reviewed by me and considered in my medical decision making (see chart for details).     Final Clinical Impressions(s) / UC Diagnoses   Final diagnoses:  Right hand tendonitis  Right lateral epicondylitis     Discharge Instructions     Rest, ice, elevation, tylenol as needed     ED Prescriptions    Medication Sig Dispense Auth. Provider   meloxicam (MOBIC) 15 MG tablet Take 1 tablet (15 mg total) by mouth daily. 30 tablet 12/22/2019, MD   HYDROcodone-acetaminophen (NORCO/VICODIN) 5-325 MG tablet 1-2 tabs po qd prn 5 tablet Payton Mccallum, MD      1. diagnosis reviewed with patient 2. rx as per orders above; reviewed possible side effects, interactions, risks and benefits  3. Recommend supportive treatment with as above with rest, ice, elevation, otc analgesic  4. Follow-up prn if symptoms worsen or don't improve   I have reviewed the PDMP during this encounter.    Payton Mccallum, MD 12/20/19 [phone]

## 2019-12-20 NOTE — Discharge Instructions (Signed)
Rest, ice, anti-inflammatories °

## 2019-12-20 NOTE — ED Provider Notes (Signed)
[facility]    CSN: [id number] Arrival date & time: 12/20/19  1851      History   Chief Complaint Chief Complaint  Patient presents with  . Hand Pain    HPI Judy Schultz is a 34 y.o. female.   34 yo female seen recently (3 days ago) and diagnosed with tendonitis of hand and lateral epicondylitis presents with a c/o continued swelling. States she went to work today but had to leave because of the pain. Patient states she's trying to work with her boss for accommodations due to her injury. 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 Patient states that she is left handed dominant but is required to do her job with her right hand only.    Hand Pain    Past Medical History:  Diagnosis Date  . Personal history of asthma     Patient Active Problem List   Diagnosis Date Noted  . Preterm premature rupture of membranes 01/06/2016  . H/O preterm delivery, currently pregnant 01/06/2016  . Oligohydramnios 01/06/2016    Past Surgical History:  Procedure Laterality Date  . ORIF ULNAR FRACTURE Left 06/23/2013   Procedure: OPEN REDUCTION INTERNAL FIXATION (ORIF) ULNAR/Radius FRACTURE;  Surgeon: Harvie Junior, MD;  Location: [facility] OR;  Service: Orthopedics;  Laterality: Left;    OB History    Gravida  2   Para  2   Term      Preterm  2   AB      Living  2     SAB      TAB      Ectopic      Multiple  0   Live Births  2            Home Medications    Prior to Admission medications   Medication Sig Start Date End Date Taking? Authorizing Provider  meloxicam (MOBIC) 15 MG tablet Take 1 tablet (15 mg total) by mouth daily. 12/17/19  Yes Payton Mccallum, MD  HYDROcodone-acetaminophen (NORCO/VICODIN) 5-325 MG tablet 1-2 tabs po qd prn 12/20/19   Payton Mccallum, MD    Family History History reviewed. No pertinent family history.  Social History Social History   Tobacco Use  . Smoking status:  Former Games developer  . Smokeless tobacco: Never Used  Substance Use Topics  . Alcohol use: No    Alcohol/week: 0.0 standard drinks  . Drug use: No     Allergies   Patient has no known allergies.   Review of Systems Review of Systems   Physical Exam Triage Vital Signs ED Triage Vitals  Enc Vitals Group     BP 12/20/19 1904 116/68     Pulse Rate 12/20/19 1904 80     Resp 12/20/19 1904 18     Temp 12/20/19 1904 98.7 F (37.1 C)     Temp Source 12/20/19 1904 Oral     SpO2 12/20/19 1904 98 %     Weight 12/20/19 1903 225 lb (102.1 kg)     Height 12/20/19 1903 5\' 9"  (1.753 m)     Head Circumference --      Peak Flow --      Pain Score 12/20/19 1903 6     Pain Loc --      Pain Edu? --      Excl. in GC? --    No data found.  Updated Vital Signs BP 116/68 (BP Location: Right Arm)  Pulse 80   Temp 98.7 F (37.1 C) (Oral)   Resp 18   Ht 5\' 9"  (1.753 m)   Wt 102.1 kg   LMP 11/26/2019 (Exact Date)   SpO2 98%   BMI 33.23 kg/m   Visual Acuity Right Eye Distance:   Left Eye Distance:   Bilateral Distance:    Right Eye Near:   Left Eye Near:    Bilateral Near:     Physical Exam Vitals and nursing note reviewed.  Constitutional:      General: She is not in acute distress.    Appearance: She is not toxic-appearing or diaphoretic.  Musculoskeletal:     Right hand: Swelling (mild over dorsum of hand) and tenderness present. No deformity or lacerations. Decreased range of motion. Decreased strength (due to pain). Normal sensation. There is no disruption of two-point discrimination. Normal capillary refill. Normal pulse.     Comments: Right hand neurovascularly intact  Neurological:     Mental Status: She is alert.      UC Treatments / Results  Labs (all labs ordered are listed, but only abnormal results are displayed) Labs Reviewed - No data to display  EKG   Radiology DG Hand Complete Right  Result Date: 12/20/2019 CLINICAL DATA:  Right hand pain and  swelling. EXAM: RIGHT HAND - COMPLETE 3+ VIEW COMPARISON:  None. FINDINGS: There is no evidence of fracture or dislocation. There is no evidence of arthropathy or other focal bone abnormality. No evidence of avascular necrosis or bone infarct. Mild soft tissue edema noted dorsally. IMPRESSION: Soft tissue edema. No osseous abnormality. Electronically Signed   By: Keith Rake M.D.   On: 12/20/2019 19:22    Procedures Procedures (including critical care time)  Medications Ordered in UC Medications - No data to display  Initial Impression / Assessment and Plan / UC Course  I have reviewed the triage vital signs and the nursing notes.  Pertinent labs & imaging results that were available during my care of the patient were reviewed by me and considered in my medical decision making (see chart for details).      Final Clinical Impressions(s) / UC Diagnoses   Final diagnoses:  Right hand tendonitis     Discharge Instructions     Rest, ice, anti-inflammatories    ED Prescriptions    Medication Sig Dispense Auth. Provider   HYDROcodone-acetaminophen (NORCO/VICODIN) 5-325 MG tablet 1-2 tabs po qd prn 5 tablet Kayra Crowell, Linward Foster, MD      1. x-ray results and diagnosis reviewed with patient 2. rx as per orders above; reviewed possible side effects, interactions, risks and benefits  3. Recommend supportive treatment with rest (splint), ice, elevation, and continue anti-inflammatory 4. Follow-up prn if symptoms worsen or don't improve  I have reviewed the PDMP during this encounter.   Norval Gable, MD 12/20/19 2002

## 2022-05-06 IMAGING — CR DG HAND COMPLETE 3+V*R*
3 series · 3 of 3 positions shown · non-contrast
Comparison: None.

CLINICAL DATA: Right hand pain and swelling.

EXAM:
RIGHT HAND - COMPLETE 3+ VIEW

[hand ap]
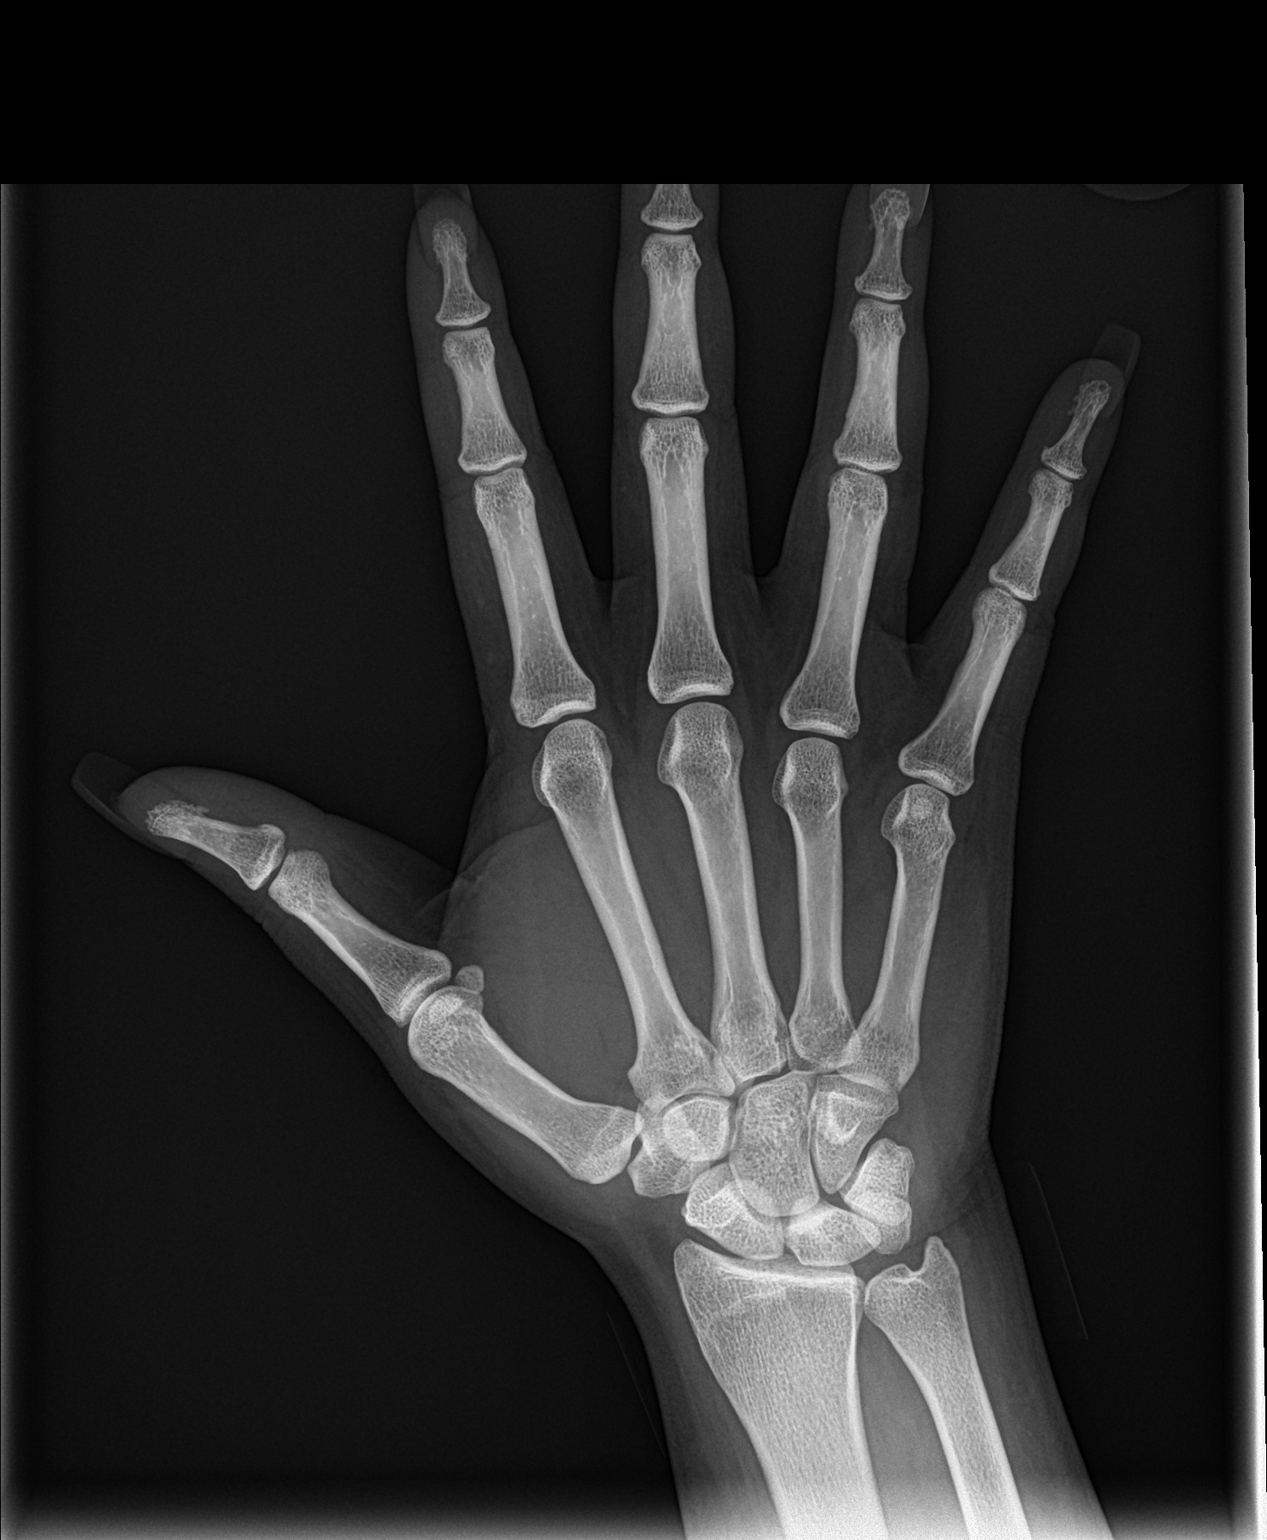

[hand obl]
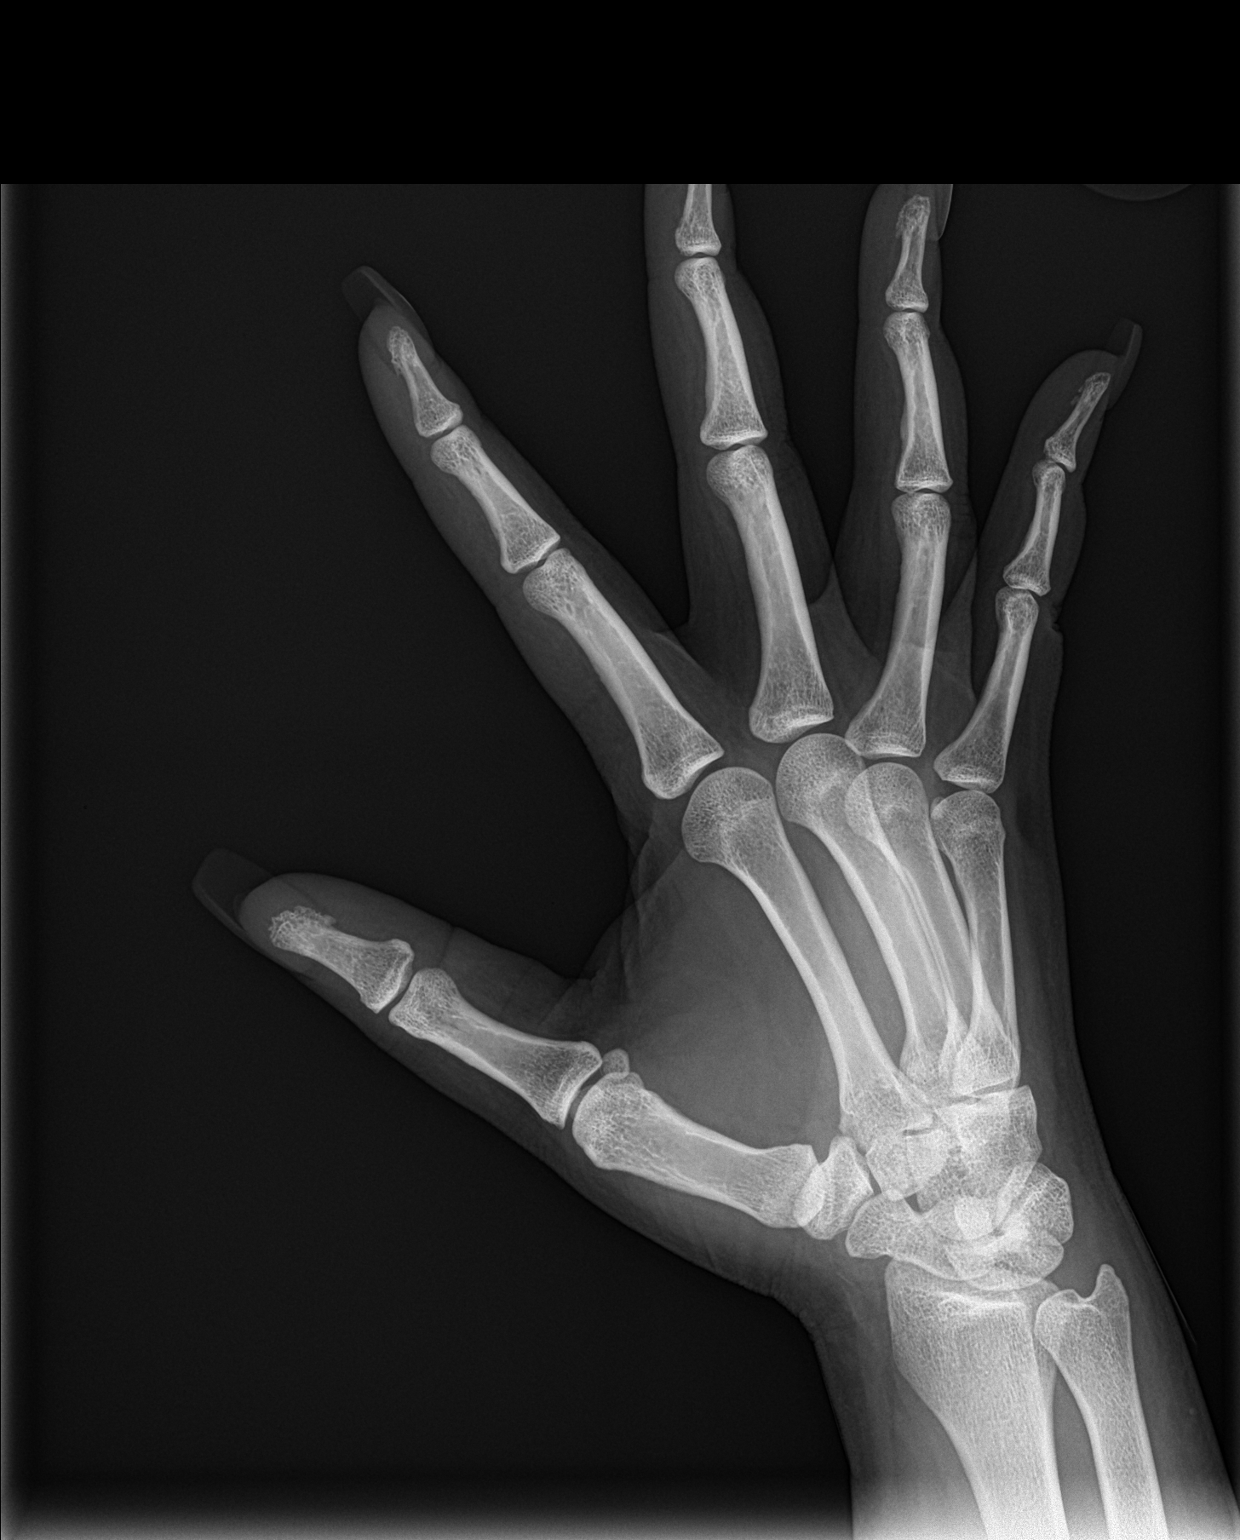

[hand lat]
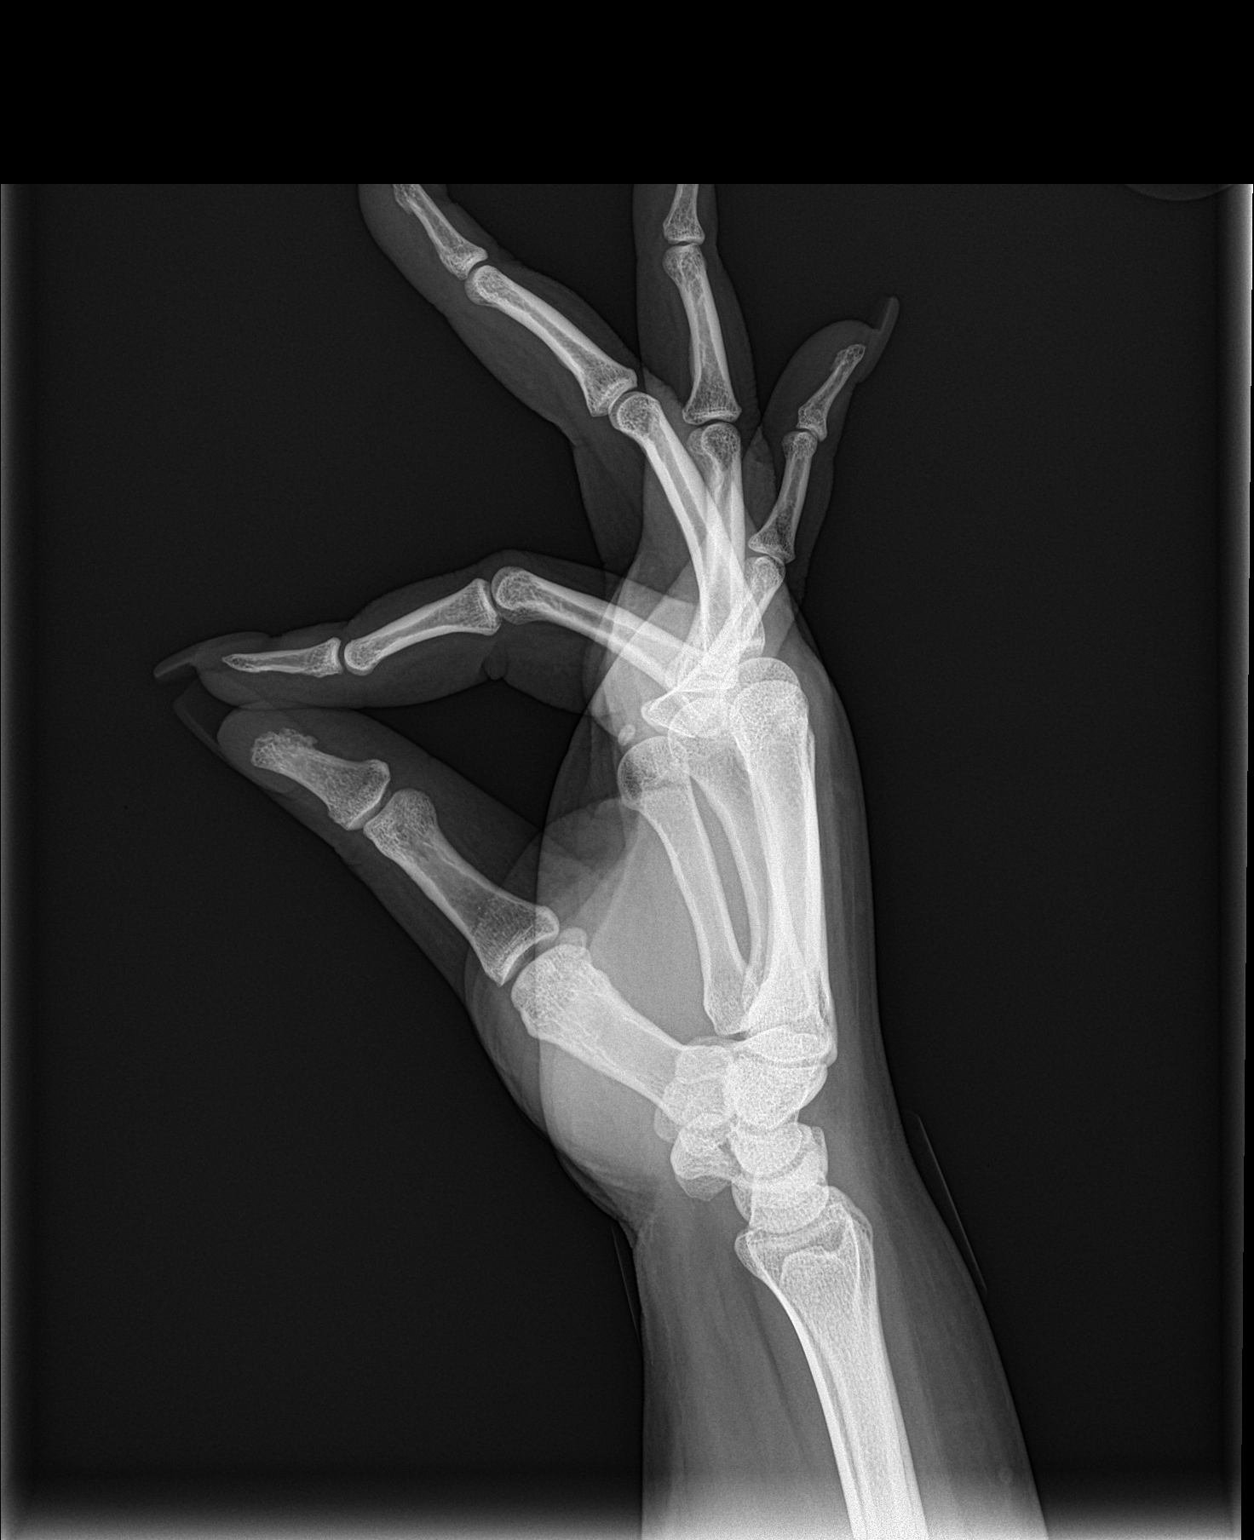

[3 of 3 positions shown; findings below may reference images not displayed]

FINDINGS: There is no evidence of fracture or dislocation. There is no
evidence of arthropathy or other focal bone abnormality. No evidence
of avascular necrosis or bone infarct. Mild soft tissue edema noted
dorsally.
IMPRESSION: Soft tissue edema. No osseous abnormality.

## 2024-04-24 ENCOUNTER — Ambulatory Visit
Admission: EM | Admit: 2024-04-24 | Discharge: 2024-04-24 | Disposition: A | Discharge status: Home / Self Care | Attending: Emergency Medicine | Admitting: Emergency Medicine

## 2024-04-24 ENCOUNTER — Encounter: Payer: Self-pay | Admitting: Emergency Medicine

## 2024-04-24 DIAGNOSIS — H01005 Unspecified blepharitis left lower eyelid: Secondary | ICD-10-CM

## 2024-04-24 MED ORDER — ERYTHROMYCIN 5 MG/GM OP OINT: TOPICAL_OINTMENT | OPHTHALMIC | 0 refills | Status: AC

## 2024-04-24 NOTE — ED Triage Notes (Signed)
 Patient reports left lower eyelid swelling that started yesterday.  Patient states that she put OTC eye drops in her left eye and states that it made it worse.  Patient reports discomfort in her left eye.

## 2024-04-24 NOTE — Discharge Instructions (Addendum)
 Perform eyelid hygiene twice daily with baby shampoo worked into a Psychiatric nurse. Scrub your eyelids 10 times and rinse thoroughly.  Following the eyelid hygiene, using a clean Q-tip, apply a 1 inch ribbon of antibiotic ointment to your eyelash margin twice daily for the first week and then once daily at bedtime for an additional 2 weeks.  You may apply warm compresses to your eye for 20-minute at a time, 2-3 times a day, to help with pain and inflammation.  You may also use over-the-counter Tylenol  and or ibuprofen  according to the package instructions as needed for pain.  If you develop any increased redness to your eyelid, swelling, pain, change in vision, or pus drainage you need to follow-up with ophthalmology.

## 2024-04-24 NOTE — ED Provider Notes (Addendum)
 [facility]    CSN: [id number] Arrival date & time: 04/24/24  1236      History   Chief Complaint Chief Complaint  Patient presents with   Eye Problem    left    HPI Judy Schultz is a 38 y.o. female.   HPI  38 year old female with past medical history significant for asthma presents for evaluation of pain and swelling to her left lower eyelid that started yesterday.  She reports that it hurts to blink but she has not experienced any changes in her vision.  She did have some crusting around her eyes this morning but they were not matted shut and there is no discharge.  Past Medical History:  Diagnosis Date   Personal history of asthma     Patient Active Problem List   Diagnosis Date Noted   Preterm premature rupture of membranes 01/06/2016   H/O preterm delivery, currently pregnant 01/06/2016   Oligohydramnios 01/06/2016    Past Surgical History:  Procedure Laterality Date   ORIF ULNAR FRACTURE Left 06/23/2013   Procedure: OPEN REDUCTION INTERNAL FIXATION (ORIF) ULNAR/Radius FRACTURE;  Surgeon: Norleen LITTIE Gavel, MD;  Location: [facility] OR;  Service: Orthopedics;  Laterality: Left;    OB History     Gravida  2   Para  2   Term      Preterm  2   AB      Living  2      SAB      IAB      Ectopic      Multiple  0   Live Births  2            Home Medications    Prior to Admission medications   Medication Sig Start Date End Date Taking? Authorizing Provider  erythromycin ophthalmic ointment Place a 1/2 inch ribbon of ointment into the lower eyelid. 04/24/24  Yes Bernardino Ditch, NP    Family History History reviewed. No pertinent family history.  Social History Social History   Tobacco Use   Smoking status: Former   Smokeless tobacco: Never  Advertising account planner   Vaping status: Never Used  Substance Use Topics   Alcohol use: No    Alcohol/week: 0.0 standard drinks of alcohol   Drug use: No     Allergies   Patient has no known  allergies.   Review of Systems Review of Systems  Eyes:  Positive for pain. Negative for photophobia, discharge, redness, itching and visual disturbance.     Physical Exam Triage Vital Signs ED Triage Vitals  Encounter Vitals Group     BP      Girls Systolic BP Percentile      Girls Diastolic BP Percentile      Boys Systolic BP Percentile      Boys Diastolic BP Percentile      Pulse      Resp      Temp      Temp src      SpO2      Weight      Height      Head Circumference      Peak Flow      Pain Score      Pain Loc      Pain Education      Exclude from Growth Chart    No data found.  Updated Vital Signs BP 106/73 (BP Location: Right Arm)   Pulse 74   Temp 98.6 F (37 C) (Oral)  Resp 15   Ht 5' 9 (1.753 m)   Wt 225 lb 1.4 oz (102.1 kg)   LMP 04/03/2024 (Approximate)   SpO2 96%   BMI 33.24 kg/m   Visual Acuity Right Eye Distance: 20/40 corrected Left Eye Distance: 20/40 corrected Bilateral Distance: 20/40 corrected  Right Eye Near:   Left Eye Near:    Bilateral Near:     Physical Exam Vitals and nursing note reviewed.  Constitutional:      Appearance: Normal appearance. She is not ill-appearing.  HENT:     Head: Normocephalic and atraumatic.  Eyes:     General: No scleral icterus.       Right eye: No discharge.        Left eye: No discharge.     Extraocular Movements: Extraocular movements intact.     Conjunctiva/sclera: Conjunctivae normal.     Pupils: Pupils are equal, round, and reactive to light.  Skin:    General: Skin is warm and dry.     Capillary Refill: Capillary refill takes less than 2 seconds.  Neurological:     General: No focal deficit present.     Mental Status: She is alert and oriented to person, place, and time.      UC Treatments / Results  Labs (all labs ordered are listed, but only abnormal results are displayed) Labs Reviewed - No data to display  EKG   Radiology No results found.  Procedures Procedures  (including critical care time)  Medications Ordered in UC Medications - No data to display  Initial Impression / Assessment and Plan / UC Course  I have reviewed the triage vital signs and the nursing notes.  Pertinent labs & imaging results that were available during my care of the patient were reviewed by me and considered in my medical decision making (see chart for details).   Patient is a pleasant, nontoxic-appearing 38 year old female presenting for evaluation of pain and swelling to left lower eyelid as outlined in HPI above.  The patient wears glasses and reports that she also wears contacts but she has not worn contacts in several months.  She has not been around anyone exhibiting eye complaints and she is unsure where this may have come from.  On exam her pupils equal round reactive and she has a normal red light reflex in the left eye.  Bulbar and labral conjunctiva are unremarkable.  As you can see the image above, the lower eyelid is edematous and erythematous.  It is tender to palpation.  I do not appreciate any potential internal or external hordeolum.  Her exam is more consistent with blepharitis.  Visual acuity OU 20/40, OD 20/40, OS 20/40 corrected.  I will discharge her home with a diagnosis of blepharitis and treat her with erythromycin eye ointment twice daily for 1 week followed by once nightly for 2 additional weeks.  Warm compresses to help with pain and inflammation along with over-the-counter Tylenol  and/or ibuprofen .  I have advised her that if her symptoms worsen that she should follow-up with her ophthalmologist.   Final Clinical Impressions(s) / UC Diagnoses   Final diagnoses:  Blepharitis of left lower eyelid, unspecified type     Discharge Instructions      Perform eyelid hygiene twice daily with baby shampoo worked into a Psychiatric nurse. Scrub your eyelids 10 times and rinse thoroughly.  Following the eyelid hygiene, using a clean Q-tip, apply a 1 inch ribbon  of antibiotic ointment to your eyelash margin twice daily  for the first week and then once daily at bedtime for an additional 2 weeks.  You may apply warm compresses to your eye for 20-minute at a time, 2-3 times a day, to help with pain and inflammation.  You may also use over-the-counter Tylenol  and or ibuprofen  according to the package instructions as needed for pain.  If you develop any increased redness to your eyelid, swelling, pain, change in vision, or pus drainage you need to follow-up with ophthalmology.       ED Prescriptions     Medication Sig Dispense Auth. Provider   erythromycin ophthalmic ointment Place a 1/2 inch ribbon of ointment into the lower eyelid. 3.5 g Bernardino Ditch, NP      PDMP not reviewed this encounter.   Bernardino Ditch, NP 04/24/24 1314    Bernardino Ditch, NP 04/24/24 1314
